# Patient Record
Sex: Male | Born: 1990 | Race: White | Hispanic: No | Marital: Single | Smoking: Never smoker
Health system: Southern US, Community
[De-identification: ages and names within clinical notes are randomized; demographics above are authoritative.]

## PROBLEM LIST (undated history)

## (undated) DIAGNOSIS — R0989 Other specified symptoms and signs involving the circulatory and respiratory systems: Secondary | ICD-10-CM

## (undated) HISTORY — PX: APPENDECTOMY: SHX54

## (undated) HISTORY — DX: Other specified symptoms and signs involving the circulatory and respiratory systems: R09.89

## (undated) HISTORY — PX: SHOULDER SURGERY: SHX246

---
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
## 2011-01-24 ENCOUNTER — Inpatient Hospital Stay (INDEPENDENT_AMBULATORY_CARE_PROVIDER_SITE_OTHER)
Admission: RE | Admit: 2011-01-24 | Discharge: 2011-01-24 | Disposition: A | Discharge status: Home / Self Care | Payer: Commercial Managed Care - PPO | Source: Ambulatory Visit | Attending: Family Medicine | Admitting: Family Medicine

## 2011-01-24 DIAGNOSIS — L259 Unspecified contact dermatitis, unspecified cause: Secondary | ICD-10-CM

## 2011-03-18 ENCOUNTER — Encounter (HOSPITAL_COMMUNITY)
Admission: RE | Admit: 2011-03-18 | Discharge: 2011-03-18 | Disposition: A | Discharge status: Home / Self Care | Payer: 59 | Source: Ambulatory Visit | Attending: Orthopedic Surgery | Admitting: Orthopedic Surgery

## 2011-03-18 DIAGNOSIS — Z0189 Encounter for other specified special examinations: Secondary | ICD-10-CM | POA: Insufficient documentation

## 2011-03-18 LAB — URINALYSIS, ROUTINE W REFLEX MICROSCOPIC
Glucose, UA: NEGATIVE mg/dL
Ketones, ur: NEGATIVE mg/dL
Nitrite: NEGATIVE
Protein, ur: NEGATIVE mg/dL
Specific Gravity, Urine: 1.024 (ref 1.005–1.030)
Urobilinogen, UA: 0.2 mg/dL (ref 0.0–1.0)
pH: 5.5 (ref 5.0–8.0)

## 2011-03-18 LAB — DIFFERENTIAL
Basophils Absolute: 0 10*3/uL (ref 0.0–0.1)
Basophils Relative: 0 % (ref 0–1)
Eosinophils Absolute: 0.1 10*3/uL (ref 0.0–0.7)
Eosinophils Relative: 1 % (ref 0–5)
Lymphocytes Relative: 28 % (ref 12–46)
Lymphs Abs: 2.2 10*3/uL (ref 0.7–4.0)
Monocytes Absolute: 0.6 10*3/uL (ref 0.1–1.0)
Neutro Abs: 5.1 10*3/uL (ref 1.7–7.7)
Neutrophils Relative %: 63 % (ref 43–77)

## 2011-03-18 LAB — BASIC METABOLIC PANEL
BUN: 21 mg/dL (ref 6–23)
CO2: 30 mEq/L (ref 19–32)
Calcium: 9 mg/dL (ref 8.4–10.5)
Chloride: 101 mEq/L (ref 96–112)
Creatinine, Ser: 0.85 mg/dL (ref 0.4–1.5)
GFR calc Af Amer: >60 mL/min (ref >60)
GFR calc non Af Amer: >60 mL/min (ref >60)
Glucose, Bld: 78 mg/dL (ref 70–99)
Potassium: 4.3 mEq/L (ref 3.5–5.1)
Sodium: 139 mEq/L (ref 135–145)

## 2011-03-18 LAB — CBC
HCT: 43 % (ref 39.0–52.0)
Hemoglobin: 15.2 g/dL (ref 13.0–17.0)
MCV: 85 fL (ref 78.0–100.0)
Platelets: 184 10*3/uL (ref 150–400)
RBC: 5.06 MIL/uL (ref 4.22–5.81)
RDW: 13 % (ref 11.5–15.5)
WBC: 8 10*3/uL (ref 4.0–10.5)

## 2011-03-18 LAB — SURGICAL PCR SCREEN: MRSA, PCR: NEGATIVE

## 2011-03-18 LAB — PROTIME-INR
INR: 0.93 (ref 0.00–1.49)
Prothrombin Time: 12.7 seconds (ref 11.6–15.2)

## 2011-03-18 LAB — APTT: aPTT: 30 seconds (ref 24–37)

## 2011-03-19 ENCOUNTER — Ambulatory Visit (HOSPITAL_COMMUNITY): Payer: 59

## 2011-03-19 ENCOUNTER — Ambulatory Visit (HOSPITAL_COMMUNITY)
Admission: RE | Admit: 2011-03-19 | Discharge: 2011-03-19 | Disposition: A | Discharge status: Home / Self Care | Payer: 59 | Source: Ambulatory Visit | Attending: Orthopedic Surgery | Admitting: Orthopedic Surgery

## 2011-03-19 DIAGNOSIS — S42009A Fracture of unspecified part of unspecified clavicle, initial encounter for closed fracture: Secondary | ICD-10-CM | POA: Insufficient documentation

## 2011-03-19 DIAGNOSIS — Z01812 Encounter for preprocedural laboratory examination: Secondary | ICD-10-CM | POA: Insufficient documentation
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
## 2011-03-24 LAB — CBC
HCT: 45.8 % (ref 36.0–49.0)
Hemoglobin: 15.9 g/dL (ref 12.0–16.0)
MCHC: 34.7 g/dL (ref 31.0–37.0)
MCV: 83.9 fL (ref 78.0–98.0)
Platelets: 213 10*3/uL (ref 150–400)
RBC: 5.47 MIL/uL (ref 3.80–5.70)
RDW: 13.5 % (ref 11.4–15.5)
WBC: 17.2 10*3/uL — ABNORMAL HIGH (ref 4.5–13.5)

## 2011-03-24 LAB — URINALYSIS, ROUTINE W REFLEX MICROSCOPIC
Bilirubin Urine: NEGATIVE
Glucose, UA: NEGATIVE mg/dL
Hgb urine dipstick: NEGATIVE
Ketones, ur: NEGATIVE mg/dL
Nitrite: NEGATIVE
Protein, ur: NEGATIVE mg/dL
Specific Gravity, Urine: 1.03 (ref 1.005–1.030)
Urobilinogen, UA: 1 mg/dL (ref 0.0–1.0)
pH: 6 (ref 5.0–8.0)

## 2011-03-24 LAB — COMPREHENSIVE METABOLIC PANEL
ALT: 21 U/L (ref 0–53)
AST: 29 U/L (ref 0–37)
Albumin: 4.2 g/dL (ref 3.5–5.2)
Alkaline Phosphatase: 107 U/L (ref 52–171)
BUN: 16 mg/dL (ref 6–23)
CO2: 27 mEq/L (ref 19–32)
Calcium: 9.2 mg/dL (ref 8.4–10.5)
Chloride: 106 mEq/L (ref 96–112)
Creatinine, Ser: 0.86 mg/dL (ref 0.4–1.5)
Glucose, Bld: 108 mg/dL — ABNORMAL HIGH (ref 70–99)
Potassium: 4 mEq/L (ref 3.5–5.1)
Sodium: 140 mEq/L (ref 135–145)
Total Bilirubin: 0.6 mg/dL (ref 0.3–1.2)
Total Protein: 6.7 g/dL (ref 6.0–8.3)

## 2011-03-24 LAB — DIFFERENTIAL
Basophils Absolute: 0.1 10*3/uL (ref 0.0–0.1)
Basophils Relative: 0 % (ref 0–1)
Eosinophils Absolute: 0.5 10*3/uL (ref 0.0–1.2)
Eosinophils Relative: 3 % (ref 0–5)
Lymphocytes Relative: 20 % — ABNORMAL LOW (ref 24–48)
Lymphs Abs: 3.5 10*3/uL (ref 1.1–4.8)
Monocytes Absolute: 1.2 10*3/uL (ref 0.2–1.2)
Monocytes Relative: 7 % (ref 3–11)
Neutro Abs: 12 10*3/uL — ABNORMAL HIGH (ref 1.7–8.0)
Neutrophils Relative %: 70 % (ref 43–71)

## 2011-03-24 LAB — LIPASE, BLOOD: Lipase: 17 U/L (ref 11–59)

## 2011-04-30 NOTE — Discharge Summary (Signed)
Michael Crane, Michael Crane           ACCOUNT NO.:  [id number]   MEDICAL RECORD NO.:  [id number]          PATIENT TYPE:  INP   LOCATION:  6124                         FACILITY:  [facility]   PHYSICIAN:  Leonia Corona, M.D.  DATE OF BIRTH:  01/22/1991   DATE OF ADMISSION:  06/30/2009  DATE OF DISCHARGE:  07/01/2009                               DISCHARGE SUMMARY   ADMISSION DIAGNOSIS:  Acute appendicitis.   FINAL DIAGNOSIS:  Acute appendicitis.   PROCEDURE PERFORMED:  Laparoscopic appendectomy.   BRIEF HISTORY AND PHYSICAL AND COURSE OF THE HOSPITAL:  This 20 year old  male child was seen in the emergency room for right lower quadrant  abdominal pain that started around the umbilicus about 12 hours ago,  clinically highly suspicious for acute appendicitis.  The diagnosis was  confirmed on CT scan, supported by the lab results of leukocytosis with  left shift.   The patient was recommended laparoscopic appendectomy.  The procedure  was discussed with parents, the risks and benefits were described and  discussed, the parents agreed and consented for the procedure.  The  patient was taken to the operating room for laparoscopic appendectomy.  The procedure was smooth and uneventful.  Postoperatively, the patient  was brought to the pediatric floor where he was kept n.p.o. for 4 hours,  and oral fluids were started after that.  He tolerated oral liquids very  well and the diet was advanced to soft diet.   Next morning on the day of discharge on first postoperative day, he was  in good general condition.  He remained afebrile.  His pain was well  controlled with pain medication including Tylenol with Codeine No. 3 one  to two tablets every 4-6 hours.  He tolerated his breakfast.  His  incision looked clean, dry, and intact.  His abdominal exam was benign.  He was active, alert, and comfortable.  He was discharged with  instruction to keep the incision clean and dry, and take a soft diet,  and use Tylenol with Codeine No. 3 one to two tablets for every 4-6  hours for pain.  A follow up visit in 10 days was set up for  postoperative check.      Leonia Corona, M.D.  Electronically Signed     SF/MEDQ  D:  07/01/2009  T:  07/01/2009  Job:  [id number]   cc:   Sallye Ober A. Twiselton, M.D.

## 2011-04-30 NOTE — Op Note (Signed)
NAMEDAMEN, WINDSOR           ACCOUNT NO.:  [id number]   MEDICAL RECORD NO.:  [id number]          PATIENT TYPE:  INP   LOCATION:  6124                         FACILITY:  [facility]   PHYSICIAN:  Leonia Corona, M.D.  DATE OF BIRTH:  [birth year]   DATE OF PROCEDURE:  DATE OF DISCHARGE:                               OPERATIVE REPORT   PREOPERATIVE DIAGNOSIS:  Acute appendicitis.   POSTOPERATIVE DIAGNOSIS:  Acute appendicitis.   PROCEDURE PERFORMED:  Laparoscopic appendectomy.   ANESTHESIA:  General endotracheal tube anesthesia.   SURGEON:  Leonia Corona, MD   ASSISTANT:  Nurse.   BRIEF PREOPERATIVE NOTE:  This 20 year old male child was seen in the  emergency room for abdominal pain which started peri-umbilically and  migrated to the right lower quadrant where there was acute tenderness on  clinical examination.  This was highly suspicious for acute  appendicitis.  The diagnosis was confirmed on CT scan and supported by  the lab results of leukocytosis with left shift.  The patient was  recommended laparoscopic appendectomy.  The procedure with its risks and  benefits were discussed with family and they consented for the  procedure.   PROCEDURE IN DETAIL:  The patient was brought into the operating room  and placed supine on the operating table.  General endotracheal tube  anesthesia was given.  The abdomen and the surrounding area of the  abdominal wall was cleaned, prepped and draped in the usual manner.  A  16-French Foley catheter was placed prior to prepping the patient to  keep the bladder deflated during the procedure.  The first incision was  made infra-umbilically in a curvilinear fashion measuring about 1.5 cm.  The incision was deepened through the subcutaneous tissue using  electrocautery.  A blunt and sharp dissection was carried out and fascia  was incised in the center to gain access into the peritoneum.  Right  index finger was introduced into the peritoneum  and the fascia was held  up with the help of 0 Vicryl as a stay suture.  A 10/12 French Hasson  cannula was introduced into the peritoneum as the stay suture was tied  over it to hold in place.  CO2 insufflation was done and  pneumoperitoneum was obtained.  A 5mm, 30-degree camera was introduced  to have a preliminary exam of the abdominal cavity.  The appendix was  found to be acutely inflamed, present in the right lower quadrant,  covered by omentum.  At this point, the second port was placed in the  right upper quadrant for which a small incision was made and the abdomen  was pierced with the trocar under direct vision of the camera from  within the peritoneal cavity.  The third trocar was placed in the left  lower quadrant.  A 5-mm port was placed through a small incision under  direct vision of the camera from within the peritoneal cavity.   Working through these three ports with the camera in the umbilical port,  the appendix was dissected.  The patient was given a head down and left  tilt position to displace the loops of  bowel and expose the right lower  quadrant for better dissection.  Appendix was grasped with a grasper in  the right upper quadrant port and mesoappendix was divided using  Harmonic scalpel until the base of the appendix was clear.  At this  point, camera was switched to the left lower quadrant port and 10-mm  Endo-GIA stapler was placed at the junction of appendix with the cecum.  After ensuring correct placement, it was fired which sealed, stapled,  and divided the appendix from the cecum.  The free appendix was  delivered out of the abdomen using an Endocatch bag through the  umbilical port along with the port.  The port was reintroduced.  Pneumoperitoneum was reestablished.  Thorough irrigation of the right  lower quadrant with normal saline was done until the returning fluid was  clear.  Fluid gravitated into the pelvis was also suctioned out until it  was  cleared.  The fluid above the liver surface was also suctioned out.  A thorough irrigation was done and suctioning was done until the  draining fluid was clear.  The patient was placed in a horizontal  position once again.  All the fluid was suctioned out.  The staple line  on the cecal wall was inspected once again.  There was no oozing,  bleeding, or leak.  Finally, the ports were removed under direct vision  of camera from within the peritoneal cavity without any evidence of  bleeding from the abdominal wall and finally the umbilical port was  removed.  The umbilical port site was closed in 2 layers, the deep layer  using 0 Vicryl for the fascia and the skin with 5-0 Monocryl  subcuticular stitch.  Approximately 18 mL of 0.25% Marcaine with  epinephrine was infiltrated in and around all the incision for  postoperative pain control.  The 5-mm port sites were closed only at the  skin level using 5-0 Monocryl in the subcuticular fashion.  Wound was  cleaned and dried.  Dermabond dressing was applied and kept open without  any gauze cover.  The patient tolerated the procedure very well.  Foley  catheter was removed prior to waking of the patient.  There was  approximately 800 mL of clear urine in the bag.  The patient was later  extubated and transported to the recovery room in good stable condition.      Leonia Corona, M.D.  Electronically Signed     SF/MEDQ  D:  06/30/2009  T:  06/30/2009  Job:  [id number]   cc:   Sallye Ober A. Twiselton, M.D.

## 2012-09-23 ENCOUNTER — Emergency Department (HOSPITAL_COMMUNITY)
Admission: EM | Admit: 2012-09-23 | Discharge: 2012-09-23 | Disposition: A | Discharge status: Home / Self Care | Payer: 59 | Source: Home / Self Care | Attending: Emergency Medicine | Admitting: Emergency Medicine

## 2012-09-23 ENCOUNTER — Encounter (HOSPITAL_COMMUNITY): Payer: Self-pay

## 2012-09-23 DIAGNOSIS — T1590XA Foreign body on external eye, part unspecified, unspecified eye, initial encounter: Secondary | ICD-10-CM

## 2012-09-23 DIAGNOSIS — H5710 Ocular pain, unspecified eye: Secondary | ICD-10-CM

## 2012-09-23 DIAGNOSIS — H5712 Ocular pain, left eye: Secondary | ICD-10-CM

## 2012-09-23 MED ORDER — BACITRACIN-POLYMYXIN B 500-10000 UNIT/GM OP OINT: TOPICAL_OINTMENT | Freq: Two times a day (BID) | OPHTHALMIC | Status: DC

## 2012-09-23 MED ORDER — TETRACAINE HCL 0.5 % OP SOLN
OPHTHALMIC | Status: AC
  Filled 2012-09-23: qty 2

## 2012-09-23 NOTE — ED Provider Notes (Signed)
History     CSN: [id number]  Arrival date & time 09/23/12  1546   None     Chief Complaint  Patient presents with  . Eye Problem    (Consider location/radiation/quality/duration/timing/severity/associated sxs/prior treatment) Patient is a 21 y.o. male presenting with eye problem. The history is provided by the patient.  Eye Problem  This is a new problem. The current episode started more than 2 days ago (4 days ago). The problem occurs daily. The problem has not changed since onset.There is pain in the left eye. The injury mechanism was a chemical exposure (working on car, foreign body sensation). The pain is mild. There is no history of trauma to the eye. There is no known exposure to pink eye. He does not wear contacts. Associated symptoms include blurred vision, foreign body sensation and eye redness. Pertinent negatives include no decreased vision, no discharge, no photophobia, no nausea, no vomiting, no tingling, no weakness and no itching.    History reviewed. No pertinent past medical history.  Past Surgical History  Procedure Date  . Appendectomy   . Shoulder surgery     pin in left shoulder    No family history on file.  History  Substance Use Topics  . Smoking status: Not on file  . Smokeless tobacco: Current User    Types: Chew  . Alcohol Use: No      Review of Systems  Constitutional: Negative.   HENT: Negative.   Eyes: Positive for blurred vision, pain, redness and visual disturbance. Negative for photophobia, discharge and itching.  Respiratory: Negative.   Cardiovascular: Negative.   Gastrointestinal: Negative for nausea and vomiting.  Skin: Negative for itching.  Neurological: Negative for tingling and weakness.    Allergies  Review of patient's allergies indicates no known allergies.  Home Medications   Current Outpatient Rx  Name Route Sig Dispense Refill  . BACITRACIN-POLYMYXIN B 500-10000 UNIT/GM OP OINT Left Eye Place into the left eye  every 12 (twelve) hours. apply to eye every 12 hours while awake 3.5 g 0    BP 129/54  Pulse 60  Temp 97.5 F (36.4 C) (Oral)  Resp 15  SpO2 100%  Physical Exam  Nursing note and vitals reviewed. Constitutional: He is oriented to person, place, and time. Vital signs are normal. He appears well-developed and well-nourished. He is active and cooperative.  HENT:  Head: Normocephalic.  Eyes: EOM are normal. Pupils are equal, round, and reactive to light. Right eye exhibits no chemosis, no discharge, no exudate and no hordeolum. No foreign body present in the right eye. Left eye exhibits no chemosis, no discharge, no exudate and no hordeolum. Foreign body present in the left eye. Right conjunctiva is not injected. Right conjunctiva has no hemorrhage. Left conjunctiva is injected. Left conjunctiva has no hemorrhage. No scleral icterus.         Tetracaine and fluoroscien applied to left eye, examined with woods lamp.  Foreign body noted, unable to dislodge with moist cotton tip applicators. Pt tolerated procedure well  Neck: Trachea normal. Neck supple.  Cardiovascular: Normal rate and regular rhythm.   Pulmonary/Chest: Effort normal and breath sounds normal.  Neurological: He is alert and oriented to person, place, and time. No cranial nerve deficit or sensory deficit.  Skin: Skin is warm and dry.  Psychiatric: He has a normal mood and affect. His speech is normal and behavior is normal. Judgment and thought content normal. Cognition and memory are normal.    ED Course  Procedures (including critical care time)  Labs Reviewed - No data to display No results found.   1. Foreign body, eye   2. Left eye pain       MDM  Phone consult opthalomologist Dr. Hermina Barters see in office tomorrow morning at 0730, begin on polymyxin ointment.       Johnsie Kindred, NP 09/23/12 1717

## 2012-09-23 NOTE — ED Provider Notes (Signed)
Medical screening examination/treatment/procedure(s) were performed by non-physician practitioner and as supervising physician I was immediately available for consultation/collaboration.  I saw this patient along with Porfirio Mylar and attempted to remove the foreign body with a 22-gauge needle, but was unable to do so. I recommended that she call the ophthalmologist on call and see if she could get the patient in to see him tomorrow.  Leslee Home, M.D.   Reuben Likes, MD 09/23/12 [phone]

## 2012-09-23 NOTE — ED Notes (Signed)
Patient states he was working on a car on 09/19/2012 and thinks he got some material in it, states does have "cloudy" vision in his right eye

## 2014-07-03 DIAGNOSIS — R0989 Other specified symptoms and signs involving the circulatory and respiratory systems: Secondary | ICD-10-CM | POA: Insufficient documentation

## 2014-07-05 ENCOUNTER — Ambulatory Visit (INDEPENDENT_AMBULATORY_CARE_PROVIDER_SITE_OTHER): Payer: 59 | Admitting: Internal Medicine

## 2014-07-05 ENCOUNTER — Encounter: Payer: Self-pay | Admitting: Internal Medicine

## 2014-07-05 VITALS — BP 136/80 | HR 72 | Temp 98.4°F | Resp 16 | Ht 74.0 in | Wt 249.4 lb

## 2014-07-05 DIAGNOSIS — Z0000 Encounter for general adult medical examination without abnormal findings: Secondary | ICD-10-CM

## 2014-07-05 DIAGNOSIS — R0989 Other specified symptoms and signs involving the circulatory and respiratory systems: Secondary | ICD-10-CM

## 2014-07-05 DIAGNOSIS — R740 Nonspecific elevation of levels of transaminase and lactic acid dehydrogenase [LDH]: Secondary | ICD-10-CM

## 2014-07-05 DIAGNOSIS — Z1212 Encounter for screening for malignant neoplasm of rectum: Secondary | ICD-10-CM

## 2014-07-05 DIAGNOSIS — E559 Vitamin D deficiency, unspecified: Secondary | ICD-10-CM

## 2014-07-05 DIAGNOSIS — R7401 Elevation of levels of liver transaminase levels: Secondary | ICD-10-CM

## 2014-07-05 DIAGNOSIS — Z111 Encounter for screening for respiratory tuberculosis: Secondary | ICD-10-CM

## 2014-07-05 DIAGNOSIS — Z113 Encounter for screening for infections with a predominantly sexual mode of transmission: Secondary | ICD-10-CM

## 2014-07-05 LAB — CBC WITH DIFFERENTIAL/PLATELET
BASOS ABS: 0 10*3/uL (ref 0.0–0.1)
BASOS PCT: 0 % (ref 0–1)
EOS ABS: 0.3 10*3/uL (ref 0.0–0.7)
Eosinophils Relative: 3 % (ref 0–5)
HCT: 46.2 % (ref 39.0–52.0)
Hemoglobin: 16.2 g/dL (ref 13.0–17.0)
LYMPHS ABS: 3.1 10*3/uL (ref 0.7–4.0)
Lymphocytes Relative: 31 % (ref 12–46)
MCH: 29.1 pg (ref 26.0–34.0)
MCHC: 35.1 g/dL (ref 30.0–36.0)
MCV: 82.9 fL (ref 78.0–100.0)
Monocytes Absolute: 0.7 10*3/uL (ref 0.1–1.0)
Monocytes Relative: 7 % (ref 3–12)
NEUTROS PCT: 59 % (ref 43–77)
Neutro Abs: 5.8 10*3/uL (ref 1.7–7.7)
PLATELETS: 208 10*3/uL (ref 150–400)
RBC: 5.57 MIL/uL (ref 4.22–5.81)
RDW: 13.8 % (ref 11.5–15.5)
WBC: 9.9 10*3/uL (ref 4.0–10.5)

## 2014-07-05 NOTE — Patient Instructions (Addendum)
Recommend you take   Low dose coated Aspirin 81 mg  And Vitamin D 5,000 units /daily  ++++++++++++++++++++++++++++++++++++++++++++   Preventive Care for Adults A healthy lifestyle and preventive care can promote health and wellness. Preventive health guidelines for men include the following key practices:  A routine yearly physical is a good way to check with your health care provider about your health and preventative screening. It is a chance to share any concerns and updates on your health and to receive a thorough exam.  Visit your dentist for a routine exam and preventative care every 6 months. Brush your teeth twice a day and floss once a day. Good oral hygiene prevents tooth decay and gum disease.  The frequency of eye exams is based on your age, health, family medical history, use of contact lenses, and other factors. Follow your health care provider's recommendations for frequency of eye exams.  Eat a healthy diet. Foods such as vegetables, fruits, whole grains, low-fat dairy products, and lean protein foods contain the nutrients you need without too many calories. Decrease your intake of foods high in solid fats, added sugars, and salt. Eat the right amount of calories for you.Get information about a proper diet from your health care provider, if necessary.  Regular physical exercise is one of the most important things you can do for your health. Most adults should get at least 150 minutes of moderate-intensity exercise (any activity that increases your heart rate and causes you to sweat) each week. In addition, most adults need muscle-strengthening exercises on 2 or more days a week.  Maintain a healthy weight. The body mass index (BMI) is a screening tool to identify possible weight problems. It provides an estimate of body fat based on height and weight. Your health care provider can find your BMI and can help you achieve or maintain a healthy weight.For adults 20 years and  older:  A BMI below 18.5 is considered underweight.  A BMI of 18.5 to 24.9 is normal.  A BMI of 25 to 29.9 is considered overweight.  A BMI of 30 and above is considered obese.  Maintain normal blood lipids and cholesterol levels by exercising and minimizing your intake of saturated fat. Eat a balanced diet with plenty of fruit and vegetables. Blood tests for lipids and cholesterol should begin at age 28 and be repeated every 5 years. If your lipid or cholesterol levels are high, you are over 50, or you are at high risk for heart disease, you may need your cholesterol levels checked more frequently.Ongoing high lipid and cholesterol levels should be treated with medicines if diet and exercise are not working.  If you smoke, find out from your health care provider how to quit. If you do not use tobacco, do not start.  Lung cancer screening is recommended for adults aged 82-80 years who are at high risk for developing lung cancer because of a history of smoking. A yearly low-dose CT scan of the lungs is recommended for people who have at least a 30-pack-year history of smoking and are a current smoker or have quit within the past 15 years. A pack year of smoking is smoking an average of 1 pack of cigarettes a day for 1 year (for example: 1 pack a day for 30 years or 2 packs a day for 15 years). Yearly screening should continue until the smoker has stopped smoking for at least 15 years. Yearly screening should be stopped for people who develop a health  problem that would prevent them from having lung cancer treatment.  If you choose to drink alcohol, do not have more than 2 drinks per day. One drink is considered to be 12 ounces (355 mL) of beer, 5 ounces (148 mL) of wine, or 1.5 ounces (44 mL) of liquor.  Avoid use of street drugs. Do not share needles with anyone. Ask for help if you need support or instructions about stopping the use of drugs.  High blood pressure causes heart disease and  increases the risk of stroke. Your blood pressure should be checked at least every 1-2 years. Ongoing high blood pressure should be treated with medicines, if weight loss and exercise are not effective.  If you are 25-43 years old, ask your health care provider if you should take aspirin to prevent heart disease.  Diabetes screening involves taking a blood sample to check your fasting blood sugar level. This should be done once every 3 years, after age 21, if you are within normal weight and without risk factors for diabetes. Testing should be considered at a younger age or be carried out more frequently if you are overweight and have at least 1 risk factor for diabetes.  Colorectal cancer can be detected and often prevented. Most routine colorectal cancer screening begins at the age of 54 and continues through age 29. However, your health care provider may recommend screening at an earlier age if you have risk factors for colon cancer. On a yearly basis, your health care provider may provide home test kits to check for hidden blood in the stool. Use of a small camera at the end of a tube to directly examine the colon (sigmoidoscopy or colonoscopy) can detect the earliest forms of colorectal cancer. Talk to your health care provider about this at age 39, when routine screening begins. Direct exam of the colon should be repeated every 5-10 years through age 50, unless early forms of precancerous polyps or small growths are found.  People who are at an increased risk for hepatitis B should be screened for this virus. You are considered at high risk for hepatitis B if:  You were born in a country where hepatitis B occurs often. Talk with your health care provider about which countries are considered high risk.  Your parents were born in a high-risk country and you have not received a shot to protect against hepatitis B (hepatitis B vaccine).  You have HIV or AIDS.  You use needles to inject street  drugs.  You live with, or have sex with, someone who has hepatitis B.  You are a man who has sex with other men (MSM).  You get hemodialysis treatment.  You take certain medicines for conditions such as cancer, organ transplantation, and autoimmune conditions.  Hepatitis C blood testing is recommended for all people born from [birth year] through 1964 and any individual with known risks for hepatitis C.  Practice safe sex. Use condoms and avoid high-risk sexual practices to reduce the spread of sexually transmitted infections (STIs). STIs include gonorrhea, chlamydia, syphilis, trichomonas, herpes, HPV, and human immunodeficiency virus (HIV). Herpes, HIV, and HPV are viral illnesses that have no cure. They can result in disability, cancer, and death.  If you are at risk of being infected with HIV, it is recommended that you take a prescription medicine daily to prevent HIV infection. This is called preexposure prophylaxis (PrEP). You are considered at risk if:  You are a man who has sex with other men (MSM)  and have other risk factors.  You are a heterosexual man, are sexually active, and are at increased risk for HIV infection.  You take drugs by injection.  You are sexually active with a partner who has HIV.  Talk with your health care provider about whether you are at high risk of being infected with HIV. If you choose to begin PrEP, you should first be tested for HIV. You should then be tested every 3 months for as long as you are taking PrEP.  A one-time screening for abdominal aortic aneurysm (AAA) and surgical repair of large AAAs by ultrasound are recommended for men ages 32 to 53 years who are current or former smokers.  Healthy men should no longer receive prostate-specific antigen (PSA) blood tests as part of routine cancer screening. Talk with your health care provider about prostate cancer screening.  Testicular cancer screening is not recommended for adult males who have no  symptoms. Screening includes self-exam, a health care provider exam, and other screening tests. Consult with your health care provider about any symptoms you have or any concerns you have about testicular cancer.  Use sunscreen. Apply sunscreen liberally and repeatedly throughout the day. You should seek shade when your shadow is shorter than you. Protect yourself by wearing long sleeves, pants, a wide-brimmed hat, and sunglasses year round, whenever you are outdoors.  Once a month, do a whole-body skin exam, using a mirror to look at the skin on your back. Tell your health care provider about new moles, moles that have irregular borders, moles that are larger than a pencil eraser, or moles that have changed in shape or color.  Stay current with required vaccines (immunizations).  Influenza vaccine. All adults should be immunized every year.  Tetanus, diphtheria, and acellular pertussis (Td, Tdap) vaccine. An adult who has not previously received Tdap or who does not know his vaccine status should receive 1 dose of Tdap. This initial dose should be followed by tetanus and diphtheria toxoids (Td) booster doses every 10 years. Adults with an unknown or incomplete history of completing a 3-dose immunization series with Td-containing vaccines should begin or complete a primary immunization series including a Tdap dose. Adults should receive a Td booster every 10 years.  Varicella vaccine. An adult without evidence of immunity to varicella should receive 2 doses or a second dose if he has previously received 1 dose.  Human papillomavirus (HPV) vaccine. Males aged 62-21 years who have not received the vaccine previously should receive the 3-dose series. Males aged 22-26 years may be immunized. Immunization is recommended through the age of 70 years for any male who has sex with males and did not get any or all doses earlier. Immunization is recommended for any person with an immunocompromised condition  through the age of 90 years if he did not get any or all doses earlier. During the 3-dose series, the second dose should be obtained 4-8 weeks after the first dose. The third dose should be obtained 24 weeks after the first dose and 16 weeks after the second dose.  Zoster vaccine. One dose is recommended for adults aged 45 years or older unless certain conditions are present.  Measles, mumps, and rubella (MMR) vaccine. Adults born before [birth year] generally are considered immune to measles and mumps. Adults born in [birth year] or later should have 1 or more doses of MMR vaccine unless there is a contraindication to the vaccine or there is laboratory evidence of immunity to each of the three  diseases. A routine second dose of MMR vaccine should be obtained at least 28 days after the first dose for students attending postsecondary schools, health care workers, or international travelers. People who received inactivated measles vaccine or an unknown type of measles vaccine during 1963-1967 should receive 2 doses of MMR vaccine. People who received inactivated mumps vaccine or an unknown type of mumps vaccine before 1979 and are at high risk for mumps infection should consider immunization with 2 doses of MMR vaccine. Unvaccinated health care workers born before [birth year] who lack laboratory evidence of measles, mumps, or rubella immunity or laboratory confirmation of disease should consider measles and mumps immunization with 2 doses of MMR vaccine or rubella immunization with 1 dose of MMR vaccine.  Pneumococcal 13-valent conjugate (PCV13) vaccine. When indicated, a person who is uncertain of his immunization history and has no record of immunization should receive the PCV13 vaccine. An adult aged 67 years or older who has certain medical conditions and has not been previously immunized should receive 1 dose of PCV13 vaccine. This PCV13 should be followed with a dose of pneumococcal polysaccharide (PPSV23) vaccine. The PPSV23  vaccine dose should be obtained at least 8 weeks after the dose of PCV13 vaccine. An adult aged 24 years or older who has certain medical conditions and previously received 1 or more doses of PPSV23 vaccine should receive 1 dose of PCV13. The PCV13 vaccine dose should be obtained 1 or more years after the last PPSV23 vaccine dose.  Pneumococcal polysaccharide (PPSV23) vaccine. When PCV13 is also indicated, PCV13 should be obtained first. All adults aged 67 years and older should be immunized. An adult younger than age 75 years who has certain medical conditions should be immunized. Any person who resides in a nursing home or long-term care facility should be immunized. An adult smoker should be immunized. People with an immunocompromised condition and certain other conditions should receive both PCV13 and PPSV23 vaccines. People with human immunodeficiency virus (HIV) infection should be immunized as soon as possible after diagnosis. Immunization during chemotherapy or radiation therapy should be avoided. Routine use of PPSV23 vaccine is not recommended for American Indians, [place] Natives, or people younger than 65 years unless there are medical conditions that require PPSV23 vaccine. When indicated, people who have unknown immunization and have no record of immunization should receive PPSV23 vaccine. One-time revaccination 5 years after the first dose of PPSV23 is recommended for people aged 19-64 years who have chronic kidney failure, nephrotic syndrome, asplenia, or immunocompromised conditions. People who received 1-2 doses of PPSV23 before age 23 years should receive another dose of PPSV23 vaccine at age 43 years or later if at least 5 years have passed since the previous dose. Doses of PPSV23 are not needed for people immunized with PPSV23 at or after age 14 years.  Meningococcal vaccine. Adults with asplenia or persistent complement component deficiencies should receive 2 doses of quadrivalent  meningococcal conjugate (MenACWY-D) vaccine. The doses should be obtained at least 2 months apart. Microbiologists working with certain meningococcal bacteria, [place] recruits, people at risk during an outbreak, and people who travel to or live in countries with a high rate of meningitis should be immunized. A first-year college student up through age 73 years who is living in a residence hall should receive a dose if he did not receive a dose on or after his 16th birthday. Adults who have certain high-risk conditions should receive one or more doses of vaccine.  Hepatitis A vaccine. Adults who  wish to be protected from this disease, have certain high-risk conditions, work with hepatitis A-infected animals, work in hepatitis A research labs, or travel to or work in countries with a high rate of hepatitis A should be immunized. Adults who were previously unvaccinated and who anticipate close contact with an international adoptee during the first 60 days after arrival in the [place] from a country with a high rate of hepatitis A should be immunized.  Hepatitis B vaccine. Adults should be immunized if they wish to be protected from this disease, have certain high-risk conditions, may be exposed to blood or other infectious body fluids, are household contacts or sex partners of hepatitis B positive people, are clients or workers in certain care facilities, or travel to or work in countries with a high rate of hepatitis B.  Haemophilus influenzae type b (Hib) vaccine. A previously unvaccinated person with asplenia or sickle cell disease or having a scheduled splenectomy should receive 1 dose of Hib vaccine. Regardless of previous immunization, a recipient of a hematopoietic stem cell transplant should receive a 3-dose series 6-12 months after his successful transplant. Hib vaccine is not recommended for adults with HIV infection. Preventive Service / Frequency Ages 37 to 18  Blood pressure check.** /  Every 1 to 2 years.  Lipid and cholesterol check.** / Every 5 years beginning at age 7.  Hepatitis C blood test.** / For any individual with known risks for hepatitis C.  Skin self-exam. / Monthly.  Influenza vaccine. / Every year.  Tetanus, diphtheria, and acellular pertussis (Tdap, Td) vaccine.** / Consult your health care provider. 1 dose of Td every 10 years.  Varicella vaccine.** / Consult your health care provider.  HPV vaccine. / 3 doses over 6 months, if 58 or younger.  Measles, mumps, rubella (MMR) vaccine.** / You need at least 1 dose of MMR if you were born in 1957 or later. You may also need a second dose.  Pneumococcal 13-valent conjugate (PCV13) vaccine.** / Consult your health care provider.  Pneumococcal polysaccharide (PPSV23) vaccine.** / 1 to 2 doses if you smoke cigarettes or if you have certain conditions.  Meningococcal vaccine.** / 1 dose if you are age 14 to 28 years and a Market researcher living in a residence hall, or have one of several medical conditions. You may also need additional booster doses.  Hepatitis A vaccine.** / Consult your health care provider.  Hepatitis B vaccine.** / Consult your health care provider.  Haemophilus influenzae type b (Hib) vaccine.** / Consult your health care provider.  ++++++++++++++++++++++++++++++++++++++++++++++++++++   Recommend the book "The END of DIETING" by Dr Baker Janus   and the book "The END of DIABETES " by Dr Excell Seltzer  At [email or web address] - get book & Audio CD's      Being diabetic has a  300% increased risk for heart attack, stroke, cancer, and alzheimer- type vascular dementia. It is very important that you work harder with diet by avoiding all foods that are white except chicken & fish. Avoid white rice (brown & wild rice is OK), white potatoes (sweetpotatoes in moderation is OK), White bread or wheat bread or anything made out of white flour like bagels, donuts, rolls, buns, biscuits,  cakes, pastries, cookies, pizza crust, and pasta (made from white flour & egg whites) - vegetarian pasta or spinach or wheat pasta is OK. Multigrain breads like Arnold's or [employer], or multigrain sandwich thins or flatbreads.  Diet, exercise and weight loss can reverse  and cure diabetes in the early stages.  Diet, exercise and weight loss is very important in the control and prevention of complications of diabetes which affects every system in your body, ie. Brain - dementia/stroke, eyes - glaucoma/blindness, heart - heart attack/heart failure, kidneys - dialysis, stomach - gastric paralysis, intestines - malabsorption, nerves - severe painful neuritis, circulation - gangrene & loss of a leg(s), and finally cancer and Alzheimers.    I recommend avoid fried & greasy foods,  sweets/candy, white rice (brown or wild rice or Quinoa is OK), white potatoes (sweet potatoes are OK) - anything made from white flour - bagels, doughnuts, rolls, buns, biscuits,white and wheat breads, pizza crust and traditional pasta made of white flour & egg white(vegetarian pasta or spinach or wheat pasta is OK).  Multi-grain bread is OK - like multi-grain flat bread or sandwich thins. Avoid alcohol in excess. Exercise is also important.    Eat all the vegetables you want - avoid meat, especially red meat and dairy - especially cheese.  Cheese is the most concentrated form of trans-fats which is the worst thing to clog up our arteries. Veggie cheese is OK which can be found in the fresh produce section at [employer] or [employer] or [employer]   Hypertension Hypertension is another name for high blood pressure. High blood pressure forces your heart to work harder to pump blood. A blood pressure reading has two numbers, which includes a higher number over a lower number (example: 110/72). HOME CARE   Have your blood pressure rechecked by your doctor.  Only take medicine as told by your doctor. Follow the directions  carefully. The medicine does not work as well if you skip doses. Skipping doses also puts you at risk for problems.  Do not smoke.  Monitor your blood pressure at home as told by your doctor. GET HELP IF:  You think you are having a reaction to the medicine you are taking.  You have repeat headaches or feel dizzy.  You have puffiness (swelling) in your ankles.  You have trouble with your vision. GET HELP RIGHT AWAY IF:   You get a very bad headache and are confused.  You feel weak, numb, or faint.  You get chest or belly (abdominal) pain.  You throw up (vomit).  You cannot breathe very well. MAKE SURE YOU:   Understand these instructions.  Will watch your condition.  Will get help right away if you are not doing well or get worse. Document Released: 05/20/2008 Document Revised: 12/07/2013 Document Reviewed: 09/24/2013 [facility] Patient Information 2015 [place], [place]. This information is not intended to replace advice given to you by your health care provider. Make sure you discuss any questions you have with your health care provider.   Vitamin D Deficiency Vitamin D is an important vitamin that your body needs. Having too little of it in your body is called a deficiency. A very bad deficiency can make your bones soft and can cause a condition called rickets.  Vitamin D is important to your body for different reasons, such as:   It helps your body absorb 2 minerals called calcium and phosphorus.  It helps make your bones healthy.  It may prevent some diseases, such as diabetes and multiple sclerosis.  It helps your muscles and heart. You can get vitamin D in several ways. It is a natural part of some foods. The vitamin is also added to some dairy products and cereals. Some people take vitamin D supplements.  Also, your body makes vitamin D when you are in the sun. It changes the sun's rays into a form of the vitamin that your body can use. CAUSES   Not eating enough  foods that contain vitamin D.  Not getting enough sunlight.  Having certain digestive system diseases that make it hard to absorb vitamin D. These diseases include Crohn's disease, chronic pancreatitis, and cystic fibrosis.  Having a surgery in which part of the stomach or small intestine is removed.  Being obese. Fat cells pull vitamin D out of your blood. That means that obese people may not have enough vitamin D left in their blood and in other body tissues.  Having chronic kidney or liver disease. RISK FACTORS Risk factors are things that make you more likely to develop a vitamin D deficiency. They include:  Being older.  Not being able to get outside very much.  Living in a nursing home.  Having had broken bones.  Having weak or thin bones (osteoporosis).  Having a disease or condition that changes how your body absorbs vitamin D.  Having dark skin.  Some medicines such as seizure medicines or steroids.  Being overweight or obese. SYMPTOMS Mild cases of vitamin D deficiency may not have any symptoms. If you have a very bad case, symptoms may include:  Bone pain.  Muscle pain.  Falling often.  Broken bones caused by a minor injury, due to osteoporosis. DIAGNOSIS A blood test is the best way to tell if you have a vitamin D deficiency. TREATMENT Vitamin D deficiency can be treated in different ways. Treatment for vitamin D deficiency depends on what is causing it. Options include:  Taking vitamin D supplements.  Taking a calcium supplement. Your caregiver will suggest what dose is best for you. HOME CARE INSTRUCTIONS  Take any supplements that your caregiver prescribes. Follow the directions carefully. Take only the suggested amount.  Have your blood tested 2 months after you start taking supplements.  Eat foods that contain vitamin D. Healthy choices include:  Fortified dairy products, cereals, or juices. Fortified means vitamin D has been added to the  food. Check the label on the package to be sure.  Fatty fish like salmon or trout.  Eggs.  Oysters.  Do not use a tanning bed.  Keep your weight at a healthy level. Lose weight if you need to.  Keep all follow-up appointments. Your caregiver will need to perform blood tests to make sure your vitamin D deficiency is going away. SEEK MEDICAL CARE IF:  You have any questions about your treatment.  You continue to have symptoms of vitamin D deficiency.  You have nausea or vomiting.  You are constipated.  You feel confused.  You have severe abdominal or back pain. MAKE SURE YOU:  Understand these instructions.  Will watch your condition.  Will get help right away if you are not doing well or get worse. Document Released: 02/24/2012 Document Revised: 03/29/2013 Document Reviewed: 02/24/2012 [facility] Patient Information 2015 [place], [place]. This information is not intended to replace advice given to you by your health care provider. Make sure you discuss any questions you have with your health care provider.

## 2014-07-05 NOTE — Progress Notes (Signed)
Patient ID: Michael CarolinMatthew Crane, male   DOB: [birth year], 23 y.o.   MRN: [id number]   Annual Screening Comprehensive Examination   This very nice 22 y.o.male presents for complete physical.  Patient has no major health issues. He did have an elevated BP 160/92 in July 2014, but reports a nl BP ~ 1 month ago and BP is nl today.He has had a low Testosterone 224 in the past and repeat was up to 299. He denies any Low T Sx's. Finally, patient has history of Vitamin D Deficiency of 38 in 2014 and he sporadically takes his supplements.  On no Meds  No Known Allergies  Past Medical History  Diagnosis Date  . Labile hypertension     Past Surgical History  Procedure Laterality Date  . Appendectomy    . Shoulder surgery      pin in left shoulder    Family History  Problem Relation Age of Onset  . Diabetes Father   . Heart disease Father     History   Social History  . Marital Status: Single    Spouse Name: N/A    Number of Children: N/A  . Years of Education: N/A   Occupational History  . Supervisor of a crew framing houses   Social History Main Topics  . Smoking status: Never Smoker   . Smokeless tobacco: Current User    Types: Chew  . Alcohol Use: Yes     Comment: ocassional  . Drug Use: No  . Sexual Activity: Active   Social History Narrative  . Patient relates he is signed up to enter the [employer] in 1 month    ROS Constitutional: Denies fever, chills, weight loss/gain, headaches, insomnia, fatigue, night sweats, and change in appetite. Eyes: Denies redness, blurred vision, diplopia, discharge, itchy, watery eyes.  ENT: Denies discharge, congestion, post nasal drip, epistaxis, sore throat, earache, hearing loss, dental pain, Tinnitus, Vertigo, Sinus pain, snoring.  Cardio: Denies chest pain, palpitations, irregular heartbeat, syncope, dyspnea, diaphoresis, orthopnea, PND, claudication, edema Respiratory: denies cough, dyspnea, DOE, pleurisy, hoarseness, laryngitis,  wheezing.  Gastrointestinal: Denies dysphagia, heartburn, reflux, water brash, pain, cramps, nausea, vomiting, bloating, diarrhea, constipation, hematemesis, melena, hematochezia, jaundice, hemorrhoids Genitourinary: Denies dysuria, frequency, urgency, nocturia, hesitancy, discharge, hematuria, flank pain Breast: Breast lumps, nipple discharge, bleeding.  Musculoskeletal: Denies arthralgia, myalgia, stiffness, Jt. Swelling, pain, limp, and strain/sprain. Skin: Denies puritis, rash, hives, warts, acne, eczema, changing in skin lesion Neuro: Weakness, tremor, incoordination, spasms, paresthesia, pain Psychiatric: Denies confusion, memory loss, sensory loss. Endocrine: Denies change in weight, skin, hair change, nocturia, and paresthesia, diabetic polys, visual blurring, hyper /hypo glycemic episodes.  Heme/Lymph: No excessive bleeding, bruising, enlarged lymph nodes.   Physical Exam  BP 136/80  Pulse 72  Temp(Src) 98.4 F (36.9 C) (Temporal)  Resp 16  Ht 6\' 2"  (1.88 m)  Wt 249 lb 6.4 oz (113.127 kg)  BMI 32.01 kg/m2  General Appearance: Well nourished and in no apparent distress. Eyes: PERRLA, EOMs, conjunctiva no swelling or erythema, normal fundi and vessels. Sinuses: No frontal/maxillary tenderness ENT/Mouth: EACs patent / TMs  nl. Nares clear without erythema, swelling, mucoid exudates. Oral hygiene is good. No erythema, swelling, or exudate. Tongue normal, non-obstructing. Tonsils not swollen or erythematous. Hearing normal.  Neck: Supple, thyroid normal. No bruits, nodes or JVD. Respiratory: Respiratory effort normal.  BS equal and clear bilateral without rales, rhonci, wheezing or stridor. Cardio: Heart sounds are normal with regular rate and rhythm and no murmurs, rubs or gallops. Peripheral pulses  are normal and equal bilaterally without edema. No aortic or femoral bruits. Chest: symmetric with normal excursions and percussion. Breasts: Symmetric, without lumps, nipple  discharge, retractions, or fibrocystic changes.  Abdomen: Flat, soft, with bowl sounds. Nontender, no guarding, rebound, hernias, masses, or organomegaly.  Lymphatics: Non tender without lymphadenopathy.  Genitourinary: No hernia Testes & cords are nl.  Musculoskeletal: Full ROM all peripheral extremities, joint stability, 5/5 strength, and normal gait. Skin: Warm and dry without rashes, lesions, cyanosis, clubbing or  ecchymosis.  Neuro: Cranial nerves intact, reflexes equal bilaterally. Normal muscle tone, no cerebellar symptoms. Sensation intact.  Pysch: Awake and oriented X 3, normal affect, Insight and Judgment appropriate.   Assessment and Plan  1. Annual Screening Examination 2. Labile Hypertension 3. Testosterone Deficiency  Continue prudent diet as discussed, weight control, regular exercise, and medications. Routine screening labs and tests as requested with regular follow-up as recommended.

## 2014-07-06 ENCOUNTER — Encounter: Payer: Self-pay | Admitting: Internal Medicine

## 2014-07-06 LAB — HEMOGLOBIN A1C
Hgb A1c MFr Bld: 5.5 % (ref <5.7)
Mean Plasma Glucose: 111 mg/dL (ref <117)

## 2014-07-06 LAB — URINALYSIS, MICROSCOPIC ONLY
Bacteria, UA: NONE SEEN
CASTS: NONE SEEN
Crystals: NONE SEEN
Squamous Epithelial / HPF: NONE SEEN

## 2014-07-06 LAB — LIPID PANEL
CHOL/HDL RATIO: 3.1 ratio
CHOLESTEROL: 140 mg/dL (ref 0–200)
HDL: 45 mg/dL (ref >39)
LDL Cholesterol: 77 mg/dL (ref 0–99)
TRIGLYCERIDES: 90 mg/dL (ref <150)
VLDL: 18 mg/dL (ref 0–40)

## 2014-07-06 LAB — HEPATITIS B SURFACE ANTIBODY,QUALITATIVE: Hep B S Ab: NEGATIVE

## 2014-07-06 LAB — BASIC METABOLIC PANEL WITH GFR
BUN: 19 mg/dL (ref 6–23)
CO2: 22 meq/L (ref 19–32)
CREATININE: 0.93 mg/dL (ref 0.50–1.35)
Calcium: 9.5 mg/dL (ref 8.4–10.5)
Chloride: 102 mEq/L (ref 96–112)
Glucose, Bld: 85 mg/dL (ref 70–99)
Potassium: 3.9 mEq/L (ref 3.5–5.3)
SODIUM: 141 meq/L (ref 135–145)

## 2014-07-06 LAB — HIV ANTIBODY (ROUTINE TESTING W REFLEX): HIV 1&2 Ab, 4th Generation: NONREACTIVE

## 2014-07-06 LAB — HEPATITIS A ANTIBODY, TOTAL: Hep A Total Ab: NONREACTIVE

## 2014-07-06 LAB — HEPATIC FUNCTION PANEL
ALT: 42 U/L (ref 0–53)
AST: 25 U/L (ref 0–37)
Albumin: 4.6 g/dL (ref 3.5–5.2)
Alkaline Phosphatase: 59 U/L (ref 39–117)
BILIRUBIN DIRECT: 0.1 mg/dL (ref 0.0–0.3)
BILIRUBIN TOTAL: 0.4 mg/dL (ref 0.2–1.2)
Indirect Bilirubin: 0.3 mg/dL (ref 0.2–1.2)
Total Protein: 7.6 g/dL (ref 6.0–8.3)

## 2014-07-06 LAB — HEPATITIS C ANTIBODY: HCV Ab: NEGATIVE

## 2014-07-06 LAB — HEPATITIS B CORE ANTIBODY, TOTAL: Hep B Core Total Ab: NONREACTIVE

## 2014-07-06 LAB — MICROALBUMIN / CREATININE URINE RATIO
Creatinine, Urine: 157.7 mg/dL
Microalb Creat Ratio: 3.2 mg/g (ref 0.0–30.0)
Microalb, Ur: 0.5 mg/dL (ref 0.00–1.89)

## 2014-07-06 LAB — VITAMIN D 25 HYDROXY (VIT D DEFICIENCY, FRACTURES): VIT D 25 HYDROXY: 49 ng/mL (ref 30–89)

## 2014-07-06 LAB — RPR

## 2014-07-06 LAB — TSH: TSH: 3.257 u[IU]/mL (ref 0.350–4.500)

## 2014-07-06 LAB — INSULIN, FASTING: Insulin fasting, serum: 13 u[IU]/mL (ref 3–28)

## 2014-07-06 LAB — VITAMIN B12: Vitamin B-12: 762 pg/mL (ref 211–911)

## 2014-07-06 LAB — TESTOSTERONE: Testosterone: 398 ng/dL (ref 300–890)

## 2014-07-06 LAB — MAGNESIUM: Magnesium: 2.1 mg/dL (ref 1.5–2.5)

## 2014-07-07 LAB — HEPATITIS B E ANTIBODY: Hepatitis Be Antibody: NONREACTIVE

## 2014-07-08 LAB — TB SKIN TEST
Induration: 0 mm
TB SKIN TEST: NEGATIVE

## 2014-07-11 ENCOUNTER — Emergency Department (HOSPITAL_COMMUNITY)
Admission: EM | Admit: 2014-07-11 | Discharge: 2014-07-11 | Disposition: A | Discharge status: Home / Self Care | Payer: 59 | Attending: Emergency Medicine | Admitting: Emergency Medicine

## 2014-07-11 ENCOUNTER — Encounter (HOSPITAL_COMMUNITY): Payer: Self-pay | Admitting: Emergency Medicine

## 2014-07-11 DIAGNOSIS — T63461A Toxic effect of venom of wasps, accidental (unintentional), initial encounter: Secondary | ICD-10-CM | POA: Insufficient documentation

## 2014-07-11 DIAGNOSIS — T63441A Toxic effect of venom of bees, accidental (unintentional), initial encounter: Secondary | ICD-10-CM

## 2014-07-11 DIAGNOSIS — Y939 Activity, unspecified: Secondary | ICD-10-CM | POA: Insufficient documentation

## 2014-07-11 DIAGNOSIS — T6391XA Toxic effect of contact with unspecified venomous animal, accidental (unintentional), initial encounter: Secondary | ICD-10-CM | POA: Insufficient documentation

## 2014-07-11 DIAGNOSIS — R000 Tachycardia, unspecified: Secondary | ICD-10-CM | POA: Insufficient documentation

## 2014-07-11 DIAGNOSIS — Y929 Unspecified place or not applicable: Secondary | ICD-10-CM | POA: Insufficient documentation

## 2014-07-11 DIAGNOSIS — Z79899 Other long term (current) drug therapy: Secondary | ICD-10-CM | POA: Insufficient documentation

## 2014-07-11 DIAGNOSIS — IMO0002 Reserved for concepts with insufficient information to code with codable children: Secondary | ICD-10-CM | POA: Insufficient documentation

## 2014-07-11 MED ORDER — DIPHENHYDRAMINE HCL 50 MG/ML IJ SOLN: 25.0000 mg | Freq: Once | INTRAMUSCULAR | Status: DC

## 2014-07-11 MED ORDER — DIPHENHYDRAMINE HCL 25 MG PO TABS: 25.0000 mg | ORAL_TABLET | Freq: Four times a day (QID) | ORAL | Status: DC

## 2014-07-11 MED ORDER — EPINEPHRINE 0.3 MG/0.3ML IJ SOAJ
0.3000 mg | Freq: Once | INTRAMUSCULAR | Status: AC
  Administered 2014-07-11: 0.3 mg via INTRAMUSCULAR
  Filled 2014-07-11: qty 0.3

## 2014-07-11 MED ORDER — METHYLPREDNISOLONE SODIUM SUCC 125 MG IJ SOLR
125.0000 mg | Freq: Once | INTRAMUSCULAR | Status: DC
Start: 2014-07-11 — End: 2014-07-11

## 2014-07-11 MED ORDER — PREDNISONE 10 MG PO TABS: 20.0000 mg | ORAL_TABLET | Freq: Every day | ORAL | Status: DC

## 2014-07-11 MED ORDER — PREDNISONE 20 MG PO TABS
60.0000 mg | ORAL_TABLET | Freq: Once | ORAL | Status: AC
  Administered 2014-07-11: 60 mg via ORAL
  Filled 2014-07-11: qty 3

## 2014-07-11 MED ORDER — FAMOTIDINE 20 MG PO TABS
20.0000 mg | ORAL_TABLET | Freq: Once | ORAL | Status: AC
  Administered 2014-07-11: 20 mg via ORAL
  Filled 2014-07-11: qty 1

## 2014-07-11 MED ORDER — FAMOTIDINE 20 MG PO TABS: 20.0000 mg | ORAL_TABLET | Freq: Two times a day (BID) | ORAL | Status: DC

## 2014-07-11 MED ORDER — SODIUM CHLORIDE 0.9 % IV BOLUS (SEPSIS): 1000.0000 mL | Freq: Once | INTRAVENOUS | Status: DC

## 2014-07-11 MED ORDER — FAMOTIDINE IN NACL 20-0.9 MG/50ML-% IV SOLN: 20.0000 mg | Freq: Once | INTRAVENOUS | Status: DC

## 2014-07-11 NOTE — ED Provider Notes (Signed)
Medical screening examination/treatment/procedure(s) were conducted as a shared visit with non-physician practitioner(s) and myself.  I personally evaluated the patient during the encounter.  Appears to be localized lip facial reaction only, however since reaction is on face and patient refuses Benadryl, EpiPen in ED appears reasonable.   Hurman HornJohn M Camyra Vaeth, MD 07/11/14 2212

## 2014-07-11 NOTE — Discharge Instructions (Signed)
Angioedema °Angioedema is a sudden swelling of tissues, often of the skin. It can occur on the face or genitals or in the abdomen or other body parts. The swelling usually develops over a short period and gets better in 24 to 48 hours. It often begins during the night and is found when the person wakes up. The person may also get red, itchy patches of skin (hives). Angioedema can be dangerous if it involves swelling of the air passages.  °Depending on the cause, episodes of angioedema may only happen once, come back in unpredictable patterns, or repeat for several years and then gradually fade away.  °CAUSES  °Angioedema can be caused by an allergic reaction to various triggers. It can also result from nonallergic causes, including reactions to drugs, immune system disorders, viral infections, or an abnormal gene that is passed to you from your parents (hereditary). For some people with angioedema, the cause is unknown.  °Some things that can trigger angioedema include:  °· Foods.   °· Medicines, such as ACE inhibitors, ARBs, nonsteroidal anti-inflammatory agents, or estrogen.   °· Latex.   °· Animal saliva.   °· Insect stings.   °· Dyes used in X-rays.   °· Mild injury.   °· Dental work. °· Surgery. °· Stress.   °· Sudden changes in temperature.   °· Exercise. °SIGNS AND SYMPTOMS  °· Swelling of the skin. °· Hives. If these are present, there is also intense itching. °· Redness in the affected area.   °· Pain in the affected area. °· Swollen lips or tongue. °· Breathing problems. This may happen if the air passages swell. °· Wheezing. °If internal organs are involved, there may be:  °· Nausea.   °· Abdominal pain.   °· Vomiting.   °· Difficulty swallowing.   °· Difficulty passing urine. °DIAGNOSIS  °· Your health care provider will examine the affected area and take a medical and family history. °· Various tests may be done to help determine the cause. Tests may include: °¨ Allergy skin tests to see if the problem  is an allergic reaction.   °¨ Blood tests to check for hereditary angioedema.   °¨ Tests to check for underlying diseases that could cause the condition.   °· A review of your medicines, including over-the-counter medicines, may be done. °TREATMENT  °Treatment will depend on the cause of the angioedema. Possible treatments include:  °· Removal of anything that triggered the condition (such as stopping certain medicines).   °· Medicines to treat symptoms or prevent attacks. Medicines given may include:   °¨ Antihistamines.   °¨ Epinephrine injection.   °¨ Steroids.   °· Hospitalization may be required for severe attacks. If the air passages are affected, it can be an emergency. Tubes may need to be placed to keep the airway open. °HOME CARE INSTRUCTIONS  °· Take all medicines as directed by your health care provider. °· If you were given medicines for emergency allergy treatment, always carry them with you. °· Wear a medical bracelet as directed by your health care provider.   °· Avoid known triggers. °SEEK MEDICAL CARE IF:  °· You have repeat attacks of angioedema.   °· Your attacks are more frequent or more severe despite preventive measures.   °· You have hereditary angioedema and are considering having children. It is important to discuss with your health care provider the risks of passing the condition on to your children. °SEEK IMMEDIATE MEDICAL CARE IF:  °· You have severe swelling of the mouth, tongue, or lips. °· You have difficulty breathing.   °· You have difficulty swallowing.   °· You faint. °MAKE   SURE YOU: °· Understand these instructions. °· Will watch your condition. °· Will get help right away if you are not doing well or get worse. °Document Released: 02/10/2002 Document Revised: 04/18/2014 Document Reviewed: 07/26/2013 °[employer]® Patient Information ©2015 [employer]. This information is not intended to replace advice given to you by your health care provider. Make sure you discuss any questions  you have with your health care provider. ° °Bee, Wasp, or Hornet Sting °Your caregiver has diagnosed you as having an insect sting. An insect sting appears as a red lump in the skin that sometimes has a tiny hole in the center, or it may have a stinger in the center of the wound. The most common stings are from wasps, hornets and bees. °Individuals have different reactions to insect stings. °· A normal reaction may cause pain, swelling, and redness around the sting site. °· A localized allergic reaction may cause swelling and redness that extends beyond the sting site. °· A large local reaction may continue to develop over the next 12 to 36 hours. °· On occasion, the reactions can be severe (anaphylactic reaction). An anaphylactic reaction may cause wheezing; difficulty breathing; chest pain; fainting; raised, itchy, red patches on the skin; a sick feeling to your stomach (nausea); vomiting; cramping; or diarrhea. If you have had an anaphylactic reaction to an insect sting in the past, you are more likely to have one again. °HOME CARE INSTRUCTIONS  °· With bee stings, a small sac of poison is left in the wound. Brushing across this with something such as a credit card, or anything similar, will help remove this and decrease the amount of the reaction. This same procedure will not help a wasp sting as they do not leave behind a stinger and poison sac. °· Apply a cold compress for 10 to 20 minutes every hour for 1 to 2 days, depending on severity, to reduce swelling and itching. °· To lessen pain, a paste made of water and baking soda may be rubbed on the bite or sting and left on for 5 minutes. °· To relieve itching and swelling, you may use take medication or apply medicated creams or lotions as directed. °· Only take over-the-counter or prescription medicines for pain, discomfort, or fever as directed by your caregiver. °· Wash the sting site daily with soap and water. Apply antibiotic ointment on the sting site as  directed. °· If you suffered a severe reaction: °¨ If you did not require hospitalization, an adult will need to stay with you for 24 hours in case the symptoms return. °¨ You may need to wear a medical bracelet or necklace stating the allergy. °¨ You and your family need to learn when and how to use an anaphylaxis kit or epinephrine injection. °¨ If you have had a severe reaction before, always carry your anaphylaxis kit with you. °SEEK MEDICAL CARE IF:  °· None of the above helps within 2 to 3 days. °· The area becomes red, warm, tender, and swollen beyond the area of the bite or sting. °· You have an oral temperature above 102° F (38.9° C). °SEEK IMMEDIATE MEDICAL CARE IF:  °You have symptoms of an allergic reaction which are: °· Wheezing. °· Difficulty breathing. °· Chest pain. °· Lightheadedness or fainting. °· Itchy, raised, red patches on the skin. °· Nausea, vomiting, cramping or diarrhea. °ANY OF THESE SYMPTOMS MAY REPRESENT A SERIOUS PROBLEM THAT IS AN EMERGENCY. Do not wait to see if the symptoms will go away.   Get medical help right away. Call your local emergency services (911 in [place] DO NOT drive yourself to the hospital. MAKE SURE YOU:   Understand these instructions.  Will watch your condition.  Will get help right away if you are not doing well or get worse. Document Released: 12/02/2005 Document Revised: 02/24/2012 Document Reviewed: 05/19/2010 [facility] Patient Information 2015 [place], [place]. This information is not intended to replace advice given to you by your health care provider. Make sure you discuss any questions you have with your health care provider.

## 2014-07-11 NOTE — ED Provider Notes (Signed)
CSN: [id number]     Arrival date & time 07/11/14  1138 History   First MD Initiated Contact with Patient 07/11/14 1201     Chief Complaint  Patient presents with  . Allergic Reaction     (Consider location/radiation/quality/duration/timing/severity/associated sxs/prior Treatment) HPI  Patient to the ED with complaints of bee sting to lower lip. 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 His lip has swollen up significantly. He does admit that it has been slowly feeling tighter and tighter. He has not had any swelling of the tongue, throat, no SOB, wheezing, or rash. He admits to being anxious but it because he doesn't like hospitals.    History reviewed. No pertinent past medical history. Past Surgical History  Procedure Laterality Date  . Appendectomy    . Shoulder surgery      pin in left shoulder   Family History  Problem Relation Age of Onset  . Diabetes Father   . Heart disease Father    History  Substance Use Topics  . Smoking status: Never Smoker   . Smokeless tobacco: Current User    Types: Chew  . Alcohol Use: Yes     Comment: ocassional    Review of Systems   Review of Systems  Gen: no weight loss, fevers, chills, night sweats  Eyes: no discharge or drainage, no occular pain or visual changes  Nose: no epistaxis or rhinorrhea  Mouth: no dental pain, no sore throat  + bottom lip swelling Neck: no neck pain  Lungs:No wheezing or hemoptysis No coughing CV:  No palpitations, dependent edema or orthopnea. No chest pain Abd: no diarrhea. No nausea or vomiting, No abdominal pain  GU: no dysuria or gross hematuria  MSK:  No muscle weakness, No  pain Neuro: no headache, no focal neurologic deficits  Skin: no rash , no wounds Psyche: no complaints    Allergies  Review of patient's allergies indicates no known allergies.  Home Medications   Prior to Admission medications   Medication Sig Start Date End Date Taking? Authorizing Provider   diphenhydrAMINE (BENADRYL) 25 MG tablet Take 1 tablet (25 mg total) by mouth every 6 (six) hours. 07/11/14   Karmon Andis Irine Seal, PA-C  famotidine (PEPCID) 20 MG tablet Take 1 tablet (20 mg total) by mouth 2 (two) times daily. 07/11/14   Lui Bellis Irine Seal, PA-C  predniSONE (DELTASONE) 10 MG tablet Take 2 tablets (20 mg total) by mouth daily. 07/11/14   Jakaria Lavergne Irine Seal, PA-C   BP 122/62  Pulse 95  Temp(Src) 98.5 F (36.9 C) (Oral)  Resp 15  Ht 6\' 2"  (1.88 m)  Wt 245 lb (111.131 kg)  BMI 31.44 kg/m2  SpO2 98% Physical Exam  Nursing note and vitals reviewed. Constitutional: He appears well-developed and well-nourished. No distress.  HENT:  Head: Normocephalic and atraumatic. Head is without right periorbital erythema and without left periorbital erythema.  Nose: Nose normal.  Mouth/Throat: Uvula is midline, oropharynx is clear and moist and mucous membranes are normal.  Significant swelling to entire bottom lip, no intra oral involvement.  Eyes: Pupils are equal, round, and reactive to light.  Neck: Normal range of motion. Neck supple. No tracheal tenderness present. No rigidity.  Cardiovascular: Regular rhythm.  Tachycardia present.   Pulmonary/Chest: Effort normal and breath sounds normal. No accessory muscle usage or stridor. Not tachypneic. No respiratory distress.  Abdominal: Soft.  Neurological: He is alert.  Skin: Skin is warm and dry. No  rash noted.    ED Course  Procedures (including critical care time) Labs Review Labs Reviewed - No data to display  Imaging Review No results found.   EKG Interpretation None      MDM   Final diagnoses:  Bee sting, accidental or unintentional, initial encounter   12:18 pm I asked Dr. Fonnie JarvisBednar to see the patient after my examination as I think he may benefit from a dose of epinephrine.  The patient would like to go back to work after leaving the hospital and prefers no Benadryl or IV. Dr. Fonnie JarvisBednar and him discussed taking PO 60 mg  Prednisone, PO 20 mg Pepcid and IM Epi. Then he will be monitored for at least an hour.   1:41 pm Swelling is significantly improved. Patient has had no airway involvement during his stay and is ready to go home. diphenhydrAMINE (BENADRYL) 25 MG tablet Take 1 tablet (25 mg total) by mouth every 6 (six) hours. 20 tablet Dorthula Matasiffany G Markeda Narvaez, PA-C   famotidine (PEPCID) 20 MG tablet Take 1 tablet (20 mg total) by mouth 2 (two) times daily. 30 tablet Dorthula Matasiffany G Shalandria Elsbernd, PA-C   predniSONE (DELTASONE) 10 MG tablet Take 2 tablets (20 mg total) by mouth daily. 21 tablet Dorthula Matasiffany G Future Yeldell, PA-C       23 y.o.Michael Crane's evaluation in the Emergency Department is complete. It has been determined that no acute conditions requiring further emergency intervention are present at this time. The patient/guardian have been advised of the diagnosis and plan. We have discussed signs and symptoms that warrant return to the ED, such as changes or worsening in symptoms.  Vital signs are stable at discharge. Filed Vitals:   07/11/14 1230  BP: 122/62  Pulse: 95  Temp:   Resp: 15    Patient/guardian has voiced understanding and agreed to follow-up with the PCP or specialist.   Dorthula Matasiffany G Bary Limbach, PA-C 07/11/14 1342

## 2014-07-11 NOTE — ED Notes (Signed)
Per pt sts he was working outside and stung by a bee on the lip. Pt has significant swelling to bottom lip and sts his tongue feels like its swelling. Denies any trouble breathing.

## 2014-08-17 ENCOUNTER — Emergency Department (HOSPITAL_COMMUNITY)
Admission: EM | Admit: 2014-08-17 | Discharge: 2014-08-17 | Disposition: A | Discharge status: Home / Self Care | Payer: 59 | Attending: Emergency Medicine | Admitting: Emergency Medicine

## 2014-08-17 ENCOUNTER — Encounter (HOSPITAL_COMMUNITY): Payer: Self-pay | Admitting: Emergency Medicine

## 2014-08-17 ENCOUNTER — Emergency Department (HOSPITAL_COMMUNITY): Payer: 59

## 2014-08-17 DIAGNOSIS — M25569 Pain in unspecified knee: Secondary | ICD-10-CM | POA: Insufficient documentation

## 2014-08-17 DIAGNOSIS — M791 Myalgia, unspecified site: Secondary | ICD-10-CM

## 2014-08-17 DIAGNOSIS — B349 Viral infection, unspecified: Secondary | ICD-10-CM

## 2014-08-17 DIAGNOSIS — R51 Headache: Secondary | ICD-10-CM | POA: Diagnosis not present

## 2014-08-17 DIAGNOSIS — H5710 Ocular pain, unspecified eye: Secondary | ICD-10-CM | POA: Insufficient documentation

## 2014-08-17 DIAGNOSIS — M2550 Pain in unspecified joint: Secondary | ICD-10-CM

## 2014-08-17 DIAGNOSIS — R509 Fever, unspecified: Secondary | ICD-10-CM | POA: Insufficient documentation

## 2014-08-17 DIAGNOSIS — R079 Chest pain, unspecified: Secondary | ICD-10-CM | POA: Diagnosis not present

## 2014-08-17 DIAGNOSIS — B9789 Other viral agents as the cause of diseases classified elsewhere: Secondary | ICD-10-CM | POA: Diagnosis not present

## 2014-08-17 DIAGNOSIS — R519 Headache, unspecified: Secondary | ICD-10-CM

## 2014-08-17 LAB — BASIC METABOLIC PANEL
Anion gap: 13 (ref 5–15)
BUN: 14 mg/dL (ref 6–23)
CALCIUM: 8.9 mg/dL (ref 8.4–10.5)
CO2: 24 mEq/L (ref 19–32)
Chloride: 102 mEq/L (ref 96–112)
Creatinine, Ser: 0.89 mg/dL (ref 0.50–1.35)
GLUCOSE: 93 mg/dL (ref 70–99)
POTASSIUM: 3.8 meq/L (ref 3.7–5.3)
SODIUM: 139 meq/L (ref 137–147)

## 2014-08-17 LAB — CBC
HCT: 41.9 % (ref 39.0–52.0)
HEMOGLOBIN: 14.8 g/dL (ref 13.0–17.0)
MCH: 29.4 pg (ref 26.0–34.0)
MCHC: 35.3 g/dL (ref 30.0–36.0)
MCV: 83.3 fL (ref 78.0–100.0)
PLATELETS: 183 10*3/uL (ref 150–400)
RBC: 5.03 MIL/uL (ref 4.22–5.81)
RDW: 12.9 % (ref 11.5–15.5)
WBC: 7.6 10*3/uL (ref 4.0–10.5)

## 2014-08-17 LAB — I-STAT TROPONIN, ED: TROPONIN I, POC: 0 ng/mL (ref 0.00–0.08)

## 2014-08-17 MED ORDER — SODIUM CHLORIDE 0.9 % IV BOLUS (SEPSIS)
1000.0000 mL | Freq: Once | INTRAVENOUS | Status: AC
  Administered 2014-08-17: 1000 mL via INTRAVENOUS

## 2014-08-17 MED ORDER — KETOROLAC TROMETHAMINE 30 MG/ML IJ SOLN
30.0000 mg | Freq: Once | INTRAMUSCULAR | Status: AC
  Administered 2014-08-17: 30 mg via INTRAVENOUS
  Filled 2014-08-17: qty 1

## 2014-08-17 MED ORDER — ACETAMINOPHEN 500 MG PO TABS
1000.0000 mg | ORAL_TABLET | Freq: Once | ORAL | Status: AC
  Administered 2014-08-17: 1000 mg via ORAL
  Filled 2014-08-17: qty 2

## 2014-08-17 MED ORDER — ALBUTEROL SULFATE (2.5 MG/3ML) 0.083% IN NEBU
5.0000 mg | INHALATION_SOLUTION | Freq: Once | RESPIRATORY_TRACT | Status: AC
  Administered 2014-08-17: 5 mg via RESPIRATORY_TRACT
  Filled 2014-08-17: qty 6

## 2014-08-17 NOTE — ED Provider Notes (Signed)
CSN: [id number]     Arrival date & time 08/17/14  1847 History   First MD Initiated Contact with Patient 08/17/14 2014     Chief Complaint  Patient presents with  . Chest Pain    The patient said he started having chest pain about 1430hrs this afternoon.  He says his pain is intermittent and went away and came back.    . Fever  . Cough  . Muscle Pain     (Consider location/radiation/quality/duration/timing/severity/associated sxs/prior Treatment) HPI Patient presents to the emergency department with multiple complaints. Patient states that yesterday he was bitten on the hip by an insect. Wednesday he states he is feeling unwell and had a slight headache. He took Motrin and was able to get to sleep. Today around 1:30 his symptoms acutely worsened. He complains of chills, myalgias, arthralgias. He has a tickle in his chest, cough, and chest tightness which he states feels like a muscle spasm. He also c/o of severe headache, eye pain. He denies neck stiffness, rash. He has mild sensitivity to light. He has not been vaccicnated for viral meningitis.    History reviewed. No pertinent past medical history. Past Surgical History  Procedure Laterality Date  . Appendectomy    . Shoulder surgery      pin in left shoulder   Family History  Problem Relation Age of Onset  . Diabetes Father   . Heart disease Father    History  Substance Use Topics  . Smoking status: Never Smoker   . Smokeless tobacco: Current User    Types: Chew  . Alcohol Use: Yes     Comment: ocassional    Review of Systems  Ten systems reviewed and are negative for acute change, except as noted in the HPI.    Allergies  Review of patient's allergies indicates no known allergies.  Home Medications   Prior to Admission medications   Medication Sig Start Date End Date Taking? Authorizing Provider  diphenhydrAMINE (BENADRYL) 25 MG tablet Take 1 tablet (25 mg total) by mouth every 6 (six) hours. 07/11/14  Yes  Tiffany Irine Seal, PA-C  ibuprofen (ADVIL,MOTRIN) 200 MG tablet Take 600 mg by mouth every 6 (six) hours as needed for mild pain.   Yes Historical Provider, MD   BP 144/66  Pulse 99  Temp(Src) 99.4 F (37.4 C) (Oral)  Resp 16  Ht  (1.88 m)  Wt 250 lb (113.399 kg)  BMI 32.08 kg/m2  SpO2 99% Physical Exam Appears moderately ill but not toxic; temperature as noted in vitals. Ears normal. Eyes:glassy,red appearance, no discharge, perrl, eomi Heart: tachycardic, NO M/G/R Throat and pharynx normal.   Neck supple. No adenopathyhy in the neck.  Sinuses non tender.  The chest is clear. Dry cough. Nontender Abdomen is soft and nontender There is a 2 cm lesion on the left hip consistent with insect bite. Erythematous complexion, hot to touch Neuro: No meningismus, negative Kernings  ED Course  Procedures (including critical care time) Labs Review Labs Reviewed  CBC  BASIC METABOLIC PANEL  I-STAT TROPOININ, ED    Imaging Review No results found.   EKG Interpretation   Date/Time:  Wednesday August 17 2014 18:52:34 EDT Ventricular Rate:  114 PR Interval:  122 QRS Duration: 74 QT Interval:  284 QTC Calculation: 391 R Axis:   60 Text Interpretation:  Sinus tachycardia Nonspecific T wave abnormality  Artifact No old tracing to compare Confirmed by [facility]  MD, Nicholos Johns  [phone]) on 08/17/2014 8:58:21 PM  MDM   Final diagnoses:  None    9:16 PM  I took the patient;s oral temp and found it to be 101.8  I feel the patient's sxs are most consistent with an impendig viral infection given his fever, myalgias, arthralgis.  Labs are normal. EKG is unremarkable.  Treat with fluids, 1000 tylenol, IV toradol, and albuterol    11:07 PM BP 119/43  Pulse 109  Temp(Src) 101.1 F (38.4 C) (Oral)  Resp 24  Ht  (1.88 m)  Wt 250 lb (113.399 kg)  BMI 32.08 kg/m2  SpO2 97% Pateint  Fever rechecked  And 99.4 on my exam.; He still has some milde myalgia  And  arthralgia in the legs, but her denies andy headache, neck stiffness, photophobia/phonophobia and recheck shows no meningismus. He states that he is feeling much better. Will discharge with strong warning precautions. He should follow up with his pcp.  I personally reviewed the imaging tests through PACS system. I have reviewed and interpreted Lab values. I reviewed available ER/hospitalization records through the EMR      Arthor Captain, [place] 08/17/14 2309

## 2014-08-17 NOTE — ED Notes (Addendum)
The patient said he started having chest pain about 1430hrs this afternoon.  He says his pain is intermittent and went away and came back.  The patient said he was bitten by a spider but her does not know what kind of spider it was.  He says he remembers picking up his little boy and driving home and he does not remember after that.  He says he woke up with the chest pain and they brought him here.  He also added that when the chest pain comes his legs "cramp up".

## 2014-08-17 NOTE — Discharge Instructions (Signed)
You are having a headache. No specific cause was found today for your headache. It may have been a migraine or other cause of headache. Stress, anxiety, fatigue, and depression are common triggers for headaches. Your headache today does not appear to be life-threatening or require hospitalization, but often the exact cause of headaches is not determined in the emergency department. Therefore, follow-up with your doctor is very important to find out what may have caused your headache, and whether or not you need any further diagnostic testing or treatment. Sometimes headaches can appear benign (not harmful), but then more serious symptoms can develop which should prompt an immediate re-evaluation by your doctor or the emergency department. SEEK MEDICAL ATTENTION IF: You develop possible problems with medications prescribed.  The medications don't resolve your headache, if it recurs , or if you have multiple episodes of vomiting or can't take fluids. You have a change from the usual headache. RETURN IMMEDIATELY IF you develop a sudden, severe headache or confusion, become poorly responsive or faint, develop a fever above 100.59F or problem breathing, have a change in speech, vision, swallowing, or understanding, or develop new weakness, numbness, tingling, incoordination, or have a seizure.   Fever, Adult A fever is a higher than normal body temperature. In an adult, an oral temperature around 98.6 F (37 C) is considered normal. A temperature of 100.4 F (38 C) or higher is generally considered a fever. Mild or moderate fevers generally have no long-term effects and often do not require treatment. Extreme fever (greater than or equal to 106 F or 41.1 C) can cause seizures. The sweating that may occur with repeated or prolonged fever may cause dehydration. Elderly people can develop confusion during a fever. A measured temperature can vary with:  Age.  Time of day.  Method of measurement (mouth,  underarm, rectal, or ear). The fever is confirmed by taking a temperature with a thermometer. Temperatures can be taken different ways. Some methods are accurate and some are not.  An oral temperature is used most commonly. Electronic thermometers are fast and accurate.  An ear temperature will only be accurate if the thermometer is positioned as recommended by the manufacturer.  A rectal temperature is accurate and done for those adults who have a condition where an oral temperature cannot be taken.  An underarm (axillary) temperature is not accurate and not recommended. Fever is a symptom, not a disease.  CAUSES   Infections commonly cause fever.  Some noninfectious causes for fever include:  Some arthritis conditions.  Some thyroid or adrenal gland conditions.  Some immune system conditions.  Some types of cancer.  A medicine reaction.  High doses of certain street drugs such as methamphetamine.  Dehydration.  Exposure to high outside or room temperatures.  Occasionally, the source of a fever cannot be determined. This is sometimes called a "fever of unknown origin" (FUO).  Some situations may lead to a temporary rise in body temperature that may go away on its own. Examples are:  Childbirth.  Surgery.  Intense exercise. HOME CARE INSTRUCTIONS   Take appropriate medicines for fever. Follow dosing instructions carefully. If you use acetaminophen to reduce the fever, be careful to avoid taking other medicines that also contain acetaminophen. Do not take aspirin for a fever if you are younger than age 62. There is an association with Reye's syndrome. Reye's syndrome is a rare but potentially deadly disease.  If an infection is present and antibiotics have been prescribed, take them as directed.  Finish them even if you start to feel better.  Rest as needed.  Maintain an adequate fluid intake. To prevent dehydration during an illness with prolonged or recurrent fever,  you may need to drink extra fluid.Drink enough fluids to keep your urine clear or pale yellow.  Sponging or bathing with room temperature water may help reduce body temperature. Do not use ice water or alcohol sponge baths.  Dress comfortably, but do not over-bundle. SEEK MEDICAL CARE IF:   You are unable to keep fluids down.  You develop vomiting or diarrhea.  You are not feeling at least partly better after 3 days.  You develop new symptoms or problems. SEEK IMMEDIATE MEDICAL CARE IF:   You have shortness of breath or trouble breathing.  You develop excessive weakness.  You are dizzy or you faint.  You are extremely thirsty or you are making little or no urine.  You develop new pain that was not there before (such as in the head, neck, chest, back, or abdomen).  You have persistent vomiting and diarrhea for more than 1 to 2 days.  You develop a stiff neck or your eyes become sensitive to light.  You develop a skin rash.  You have a fever or persistent symptoms for more than 2 to 3 days.  You have a fever and your symptoms suddenly get worse. MAKE SURE YOU:   Understand these instructions.  Will watch your condition.  Will get help right away if you are not doing well or get worse. Document Released: 05/28/2001 Document Revised: 04/18/2014 Document Reviewed: 10/03/2011 [facility] Patient Information 2015 [place], [place]. This information is not intended to replace advice given to you by your health care provider. Make sure you discuss any questions you have with your health care provider.   Viral Infections A viral infection can be caused by different types of viruses.Most viral infections are not serious and resolve on their own. However, some infections may cause severe symptoms and may lead to further complications. SYMPTOMS Viruses can frequently cause:  Minor sore throat.  Aches and pains.  Headaches.  Runny nose.  Different types of rashes.  Watery  eyes.  Tiredness.  Cough.  Loss of appetite.  Gastrointestinal infections, resulting in nausea, vomiting, and diarrhea. These symptoms do not respond to antibiotics because the infection is not caused by bacteria. However, you might catch a bacterial infection following the viral infection. This is sometimes called a "superinfection." Symptoms of such a bacterial infection may include:  Worsening sore throat with pus and difficulty swallowing.  Swollen neck glands.  Chills and a high or persistent fever.  Severe headache.  Tenderness over the sinuses.  Persistent overall ill feeling (malaise), muscle aches, and tiredness (fatigue).  Persistent cough.  Yellow, green, or brown mucus production with coughing. HOME CARE INSTRUCTIONS   Only take over-the-counter or prescription medicines for pain, discomfort, diarrhea, or fever as directed by your caregiver.  Drink enough water and fluids to keep your urine clear or pale yellow. Sports drinks can provide valuable electrolytes, sugars, and hydration.  Get plenty of rest and maintain proper nutrition. Soups and broths with crackers or rice are fine. SEEK IMMEDIATE MEDICAL CARE IF:   You have severe headaches, shortness of breath, chest pain, neck pain, or an unusual rash.  You have uncontrolled vomiting, diarrhea, or you are unable to keep down fluids.  You or your child has an oral temperature above 102 F (38.9 C), not controlled by medicine.  Your baby  is older than 3 months with a rectal temperature of 102 F (38.9 C) or higher.  Your baby is 11 months old or younger with a rectal temperature of 100.4 F (38 C) or higher. MAKE SURE YOU:   Understand these instructions.  Will watch your condition.  Will get help right away if you are not doing well or get worse. Document Released: 09/11/2005 Document Revised: 02/24/2012 Document Reviewed: 04/08/2011 [facility] Patient Information 2015 [place], [place]. This information  is not intended to replace advice given to you by your health care provider. Make sure you discuss any questions you have with your health care provider.

## 2014-08-19 NOTE — ED Provider Notes (Signed)
Medical screening examination/treatment/procedure(s) were performed by non-physician practitioner and as supervising physician I was immediately available for consultation/collaboration.   EKG Interpretation   Date/Time:  Wednesday August 17 2014 18:52:34 EDT Ventricular Rate:  114 PR Interval:  122 QRS Duration: 74 QT Interval:  284 QTC Calculation: 391 R Axis:   60 Text Interpretation:  Sinus tachycardia Nonspecific T wave abnormality  Artifact No old tracing to compare Confirmed by [facility]  MD, Nicholos Johns  [phone]) on 08/17/2014 8:58:21 PM         EKG Interpretation   Date/Time:  Wednesday August 17 2014 18:52:34 EDT Ventricular Rate:  114 PR Interval:  122 QRS Duration: 74 QT Interval:  284 QTC Calculation: 391 R Axis:   60 Text Interpretation:  Sinus tachycardia Nonspecific T wave abnormality  Artifact No old tracing to compare Confirmed by [facility]  MD, Nicholos Johns  [phone]) on 08/17/2014 8:58:21 PM        Samuel Jester, DO 08/19/14 2007

## 2015-03-23 ENCOUNTER — Other Ambulatory Visit: Payer: Self-pay | Admitting: Physician Assistant

## 2015-03-23 DIAGNOSIS — M25512 Pain in left shoulder: Secondary | ICD-10-CM

## 2015-04-17 ENCOUNTER — Ambulatory Visit
Admission: RE | Admit: 2015-04-17 | Discharge: 2015-04-17 | Disposition: A | Discharge status: Home / Self Care | Payer: 59 | Source: Ambulatory Visit | Attending: Physician Assistant | Admitting: Physician Assistant

## 2015-04-17 ENCOUNTER — Other Ambulatory Visit: Payer: Self-pay | Admitting: Physician Assistant

## 2015-04-17 DIAGNOSIS — M25512 Pain in left shoulder: Secondary | ICD-10-CM

## 2015-04-17 MED ORDER — IOHEXOL 180 MG/ML  SOLN: 15.0000 mL | Freq: Once | INTRAMUSCULAR | Status: AC | PRN

## 2015-07-05 ENCOUNTER — Other Ambulatory Visit (HOSPITAL_COMMUNITY): Payer: Self-pay | Admitting: Orthopaedic Surgery

## 2015-07-06 ENCOUNTER — Ambulatory Visit: Payer: 59 | Admitting: Internal Medicine

## 2015-07-06 NOTE — Progress Notes (Signed)
no show

## 2015-07-12 ENCOUNTER — Encounter (HOSPITAL_COMMUNITY)
Admission: RE | Admit: 2015-07-12 | Discharge: 2015-07-12 | Disposition: A | Discharge status: Home / Self Care | Payer: 59 | Source: Ambulatory Visit | Attending: Orthopaedic Surgery | Admitting: Orthopaedic Surgery

## 2015-07-12 ENCOUNTER — Encounter (HOSPITAL_COMMUNITY): Payer: Self-pay

## 2015-07-12 DIAGNOSIS — S43492A Other sprain of left shoulder joint, initial encounter: Secondary | ICD-10-CM | POA: Diagnosis not present

## 2015-07-12 DIAGNOSIS — Z01812 Encounter for preprocedural laboratory examination: Secondary | ICD-10-CM | POA: Insufficient documentation

## 2015-07-12 DIAGNOSIS — X58XXXA Exposure to other specified factors, initial encounter: Secondary | ICD-10-CM | POA: Diagnosis not present

## 2015-07-12 LAB — CBC
HEMATOCRIT: 48.9 % (ref 39.0–52.0)
Hemoglobin: 16.4 g/dL (ref 13.0–17.0)
MCH: 29.1 pg (ref 26.0–34.0)
MCHC: 33.5 g/dL (ref 30.0–36.0)
MCV: 86.9 fL (ref 78.0–100.0)
PLATELETS: 275 10*3/uL (ref 150–400)
RBC: 5.63 MIL/uL (ref 4.22–5.81)
RDW: 13.7 % (ref 11.5–15.5)
WBC: 10.1 10*3/uL (ref 4.0–10.5)

## 2015-07-12 NOTE — Pre-Procedure Instructions (Signed)
Michael Crane  07/12/2015      [facility] - [place], [place] - [address] [place] [address] Phone: [phone] Fax: [phone]    Your procedure is scheduled on Thursday  07/20/15  Report to [facility] Admitting at 830 A.M.  Call this number if you have problems the morning of surgery:  [phone]   Remember:  Do not eat food or drink liquids after midnight.  Take these medicines the morning of surgery with A SIP OF WATER  NONE   Do not wear jewelry, make-up or nail polish.  Do not wear lotions, powders, or perfumes.  You may wear deodorant.  Do not shave 48 hours prior to surgery.  Men may shave face and neck.  Do not bring valuables to the hospital.  [facility] is not responsible for any belongings or valuables.  Contacts, dentures or bridgework may not be worn into surgery.  Leave your suitcase in the car.  After surgery it may be brought to your room.  For patients admitted to the hospital, discharge time will be determined by your treatment team.  Patients discharged the day of surgery will not be allowed to drive home.   Name and phone number of your driver:   Special instructions:  [place] - Preparing for Surgery  Before surgery, you can play an important role.  Because skin is not sterile, your skin needs to be as free of germs as possible.  You can reduce the number of germs on you skin by washing with CHG (chlorahexidine gluconate) soap before surgery.  CHG is an antiseptic cleaner which kills germs and bonds with the skin to continue killing germs even after washing.  Please DO NOT use if you have an allergy to CHG or antibacterial soaps.  If your skin becomes reddened/irritated stop using the CHG and inform your nurse when you arrive at Short Stay.  Do not shave (including legs and underarms) for at least 48 hours prior to the first CHG shower.  You may shave your face.  Please  follow these instructions carefully:   1.  Shower with CHG Soap the night before surgery and the                                morning of Surgery.  2.  If you choose to wash your hair, wash your hair first as usual with your       normal shampoo.  3.  After you shampoo, rinse your hair and body thoroughly to remove the                      Shampoo.  4.  Use CHG as you would any other liquid soap.  You can apply chg directly       to the skin and wash gently with scrungie or a clean washcloth.  5.  Apply the CHG Soap to your body ONLY FROM THE NECK DOWN.        Do not use on open wounds or open sores.  Avoid contact with your eyes,       ears, mouth and genitals (private parts).  Wash genitals (private parts)       with your normal soap.  6.  Wash thoroughly, paying special attention to the area where your surgery  will be performed.  7.  Thoroughly rinse your body with warm water from the neck down.  8.  DO NOT shower/wash with your normal soap after using and rinsing off       the CHG Soap.  9.  Pat yourself dry with a clean towel.            10.  Wear clean pajamas.            11.  Place clean sheets on your bed the night of your first shower and do not        sleep with pets.  Day of Surgery  Do not apply any lotions/deoderants the morning of surgery.  Please wear clean clothes to the hospital/surgery center.    Please read over the following fact sheets that you were given. Pain Booklet, Coughing and Deep Breathing and Surgical Site Infection Prevention

## 2015-07-19 ENCOUNTER — Encounter (HOSPITAL_COMMUNITY): Payer: Self-pay | Admitting: Anesthesiology

## 2015-07-19 NOTE — Progress Notes (Signed)
Instructed patient to arrive at 900 am on 07/20/15.

## 2015-07-19 NOTE — Anesthesia Preprocedure Evaluation (Addendum)
Anesthesia Evaluation  Patient identified by MRN, date of birth, ID band Patient awake    Reviewed: Allergy & Precautions, NPO status , Patient's Chart, lab work & pertinent test results  Airway Mallampati: I  TM Distance: >3 FB Neck ROM: Full    Dental  (+) Teeth Intact   Pulmonary neg pulmonary ROS,  breath sounds clear to auscultation        Cardiovascular negative cardio ROS  Rhythm:Regular Rate:Normal     Neuro/Psych negative neurological ROS     GI/Hepatic negative GI ROS, Neg liver ROS,   Endo/Other  negative endocrine ROS  Renal/GU negative Renal ROS     Musculoskeletal   Abdominal   Peds  Hematology negative hematology ROS (+)   Anesthesia Other Findings   Reproductive/Obstetrics                            Anesthesia Physical Anesthesia Plan  ASA: I  Anesthesia Plan: General and Regional   Post-op Pain Management: GA combined w/ Regional for post-op pain   Induction: Intravenous  Airway Management Planned: Oral ETT  Additional Equipment:   Intra-op Plan:   Post-operative Plan: Extubation in OR  Informed Consent: I have reviewed the patients History and Physical, chart, labs and discussed the procedure including the risks, benefits and alternatives for the proposed anesthesia with the patient or authorized representative who has indicated his/her understanding and acceptance.   Dental advisory given  Plan Discussed with: CRNA  Anesthesia Plan Comments:        Anesthesia Quick Evaluation

## 2015-07-20 ENCOUNTER — Encounter (HOSPITAL_COMMUNITY): Payer: Self-pay | Admitting: *Deleted

## 2015-07-20 ENCOUNTER — Encounter (HOSPITAL_COMMUNITY)
Admission: RE | Disposition: A | Discharge status: Home / Self Care | Payer: Self-pay | Source: Ambulatory Visit | Attending: Orthopaedic Surgery

## 2015-07-20 ENCOUNTER — Ambulatory Visit (HOSPITAL_COMMUNITY)
Admission: RE | Admit: 2015-07-20 | Discharge: 2015-07-20 | Disposition: A | Discharge status: Home / Self Care | Payer: 59 | Source: Ambulatory Visit | Attending: Orthopaedic Surgery | Admitting: Orthopaedic Surgery

## 2015-07-20 ENCOUNTER — Ambulatory Visit (HOSPITAL_COMMUNITY): Payer: 59 | Admitting: Anesthesiology

## 2015-07-20 DIAGNOSIS — S43492A Other sprain of left shoulder joint, initial encounter: Secondary | ICD-10-CM | POA: Diagnosis not present

## 2015-07-20 DIAGNOSIS — M24412 Recurrent dislocation, left shoulder: Secondary | ICD-10-CM | POA: Diagnosis present

## 2015-07-20 DIAGNOSIS — S43432A Superior glenoid labrum lesion of left shoulder, initial encounter: Secondary | ICD-10-CM

## 2015-07-20 DIAGNOSIS — F17220 Nicotine dependence, chewing tobacco, uncomplicated: Secondary | ICD-10-CM | POA: Diagnosis not present

## 2015-07-20 DIAGNOSIS — X58XXXA Exposure to other specified factors, initial encounter: Secondary | ICD-10-CM | POA: Diagnosis not present

## 2015-07-20 DIAGNOSIS — S43439A Superior glenoid labrum lesion of unspecified shoulder, initial encounter: Secondary | ICD-10-CM

## 2015-07-20 HISTORY — PX: SHOULDER ARTHROSCOPY: SHX128

## 2015-07-20 SURGERY — ARTHROSCOPY, SHOULDER
Anesthesia: Regional | Site: Shoulder | Laterality: Left

## 2015-07-20 MED ORDER — FENTANYL CITRATE (PF) 100 MCG/2ML IJ SOLN
INTRAMUSCULAR | Status: DC | PRN
  Administered 2015-07-20 (×5): 50 ug via INTRAVENOUS

## 2015-07-20 MED ORDER — OXYCODONE-ACETAMINOPHEN 5-325 MG PO TABS: 1.0000 | ORAL_TABLET | ORAL | Status: AC | PRN

## 2015-07-20 MED ORDER — FENTANYL CITRATE (PF) 100 MCG/2ML IJ SOLN
INTRAMUSCULAR | Status: AC
  Administered 2015-07-20: 100 ug
  Filled 2015-07-20: qty 2

## 2015-07-20 MED ORDER — ARTIFICIAL TEARS OP OINT
TOPICAL_OINTMENT | OPHTHALMIC | Status: AC
  Filled 2015-07-20: qty 3.5

## 2015-07-20 MED ORDER — PROMETHAZINE HCL 25 MG/ML IJ SOLN
INTRAMUSCULAR | Status: AC
  Filled 2015-07-20: qty 1

## 2015-07-20 MED ORDER — SODIUM CHLORIDE 0.9 % IR SOLN
Status: DC | PRN
  Administered 2015-07-20 (×4): 6000 mL

## 2015-07-20 MED ORDER — ONDANSETRON HCL 4 MG/2ML IJ SOLN
INTRAMUSCULAR | Status: AC
  Filled 2015-07-20: qty 2

## 2015-07-20 MED ORDER — LACTATED RINGERS IV SOLN
INTRAVENOUS | Status: DC
  Administered 2015-07-20: 50 mL/h via INTRAVENOUS
  Administered 2015-07-20 (×2): via INTRAVENOUS

## 2015-07-20 MED ORDER — BUPIVACAINE-EPINEPHRINE (PF) 0.5% -1:200000 IJ SOLN
INTRAMUSCULAR | Status: DC | PRN
  Administered 2015-07-20: 25 mL via PERINEURAL

## 2015-07-20 MED ORDER — NEOSTIGMINE METHYLSULFATE 10 MG/10ML IV SOLN
INTRAVENOUS | Status: DC | PRN
  Administered 2015-07-20: 4 mg via INTRAVENOUS

## 2015-07-20 MED ORDER — EPINEPHRINE HCL 1 MG/ML IJ SOLN
INTRAMUSCULAR | Status: AC
  Filled 2015-07-20: qty 1

## 2015-07-20 MED ORDER — KETOROLAC TROMETHAMINE 30 MG/ML IJ SOLN
30.0000 mg | Freq: Once | INTRAMUSCULAR | Status: AC | PRN
  Administered 2015-07-20: 30 mg via INTRAVENOUS

## 2015-07-20 MED ORDER — ROCURONIUM BROMIDE 100 MG/10ML IV SOLN
INTRAVENOUS | Status: DC | PRN
  Administered 2015-07-20: 50 mg via INTRAVENOUS
  Administered 2015-07-20: 10 mg via INTRAVENOUS

## 2015-07-20 MED ORDER — HYDROMORPHONE HCL 1 MG/ML IJ SOLN: 0.2500 mg | INTRAMUSCULAR | Status: DC | PRN

## 2015-07-20 MED ORDER — MIDAZOLAM HCL 2 MG/2ML IJ SOLN
INTRAMUSCULAR | Status: AC
  Filled 2015-07-20: qty 4

## 2015-07-20 MED ORDER — KETOROLAC TROMETHAMINE 30 MG/ML IJ SOLN
INTRAMUSCULAR | Status: AC
  Filled 2015-07-20: qty 1

## 2015-07-20 MED ORDER — CEFAZOLIN SODIUM-DEXTROSE 2-3 GM-% IV SOLR
INTRAVENOUS | Status: AC
  Administered 2015-07-20: 2 g via INTRAVENOUS
  Filled 2015-07-20: qty 50

## 2015-07-20 MED ORDER — LIDOCAINE HCL (CARDIAC) 20 MG/ML IV SOLN
INTRAVENOUS | Status: AC
  Filled 2015-07-20: qty 5

## 2015-07-20 MED ORDER — BUPIVACAINE-EPINEPHRINE (PF) 0.25% -1:200000 IJ SOLN
INTRAMUSCULAR | Status: AC
  Filled 2015-07-20: qty 30

## 2015-07-20 MED ORDER — FENTANYL CITRATE (PF) 250 MCG/5ML IJ SOLN
INTRAMUSCULAR | Status: AC
  Filled 2015-07-20: qty 5

## 2015-07-20 MED ORDER — PROMETHAZINE HCL 25 MG/ML IJ SOLN
6.2500 mg | INTRAMUSCULAR | Status: DC | PRN
  Administered 2015-07-20: 6.25 mg via INTRAVENOUS

## 2015-07-20 MED ORDER — OXYCODONE HCL 5 MG/5ML PO SOLN: 5.0000 mg | Freq: Once | ORAL | Status: DC | PRN

## 2015-07-20 MED ORDER — OXYCODONE HCL 5 MG PO TABS: 5.0000 mg | ORAL_TABLET | Freq: Once | ORAL | Status: DC | PRN

## 2015-07-20 MED ORDER — PROPOFOL 10 MG/ML IV BOLUS
INTRAVENOUS | Status: DC | PRN
  Administered 2015-07-20 (×2): 200 mg via INTRAVENOUS

## 2015-07-20 MED ORDER — GLYCOPYRROLATE 0.2 MG/ML IJ SOLN
INTRAMUSCULAR | Status: DC | PRN
  Administered 2015-07-20: 0.6 mg via INTRAVENOUS

## 2015-07-20 MED ORDER — ONDANSETRON HCL 4 MG/2ML IJ SOLN
INTRAMUSCULAR | Status: DC | PRN
  Administered 2015-07-20: 4 mg via INTRAVENOUS

## 2015-07-20 MED ORDER — MIDAZOLAM HCL 2 MG/2ML IJ SOLN
INTRAMUSCULAR | Status: AC
  Administered 2015-07-20: 2 mg
  Filled 2015-07-20: qty 2

## 2015-07-20 MED ORDER — ROCURONIUM BROMIDE 50 MG/5ML IV SOLN
INTRAVENOUS | Status: AC
  Filled 2015-07-20: qty 1

## 2015-07-20 SURGICAL SUPPLY — 71 items
ANCH SUT SHRT 12.5 CANN EYLT (Anchor) ×3 IMPLANT
ANCHOR SUT BIOCOMP LK 2.9X12.5 (Anchor) ×6 IMPLANT
BLADE CUDA 4.2 (BLADE) ×3 IMPLANT
BLADE SURG 11 STRL SS (BLADE) ×5 IMPLANT
BLADE SURG ROTATE 9660 (MISCELLANEOUS) IMPLANT
BUR VERTEX HOODED 4.5 (BURR) IMPLANT
CANNULA 5.75X7 CRYSTAL CLEAR (CANNULA) IMPLANT
CANNULA SHOULDER 7CM (CANNULA) ×6 IMPLANT
CANNULA TWIST IN 8.25X7CM (CANNULA) ×4 IMPLANT
CLOSURE WOUND 1/2 X4 (GAUZE/BANDAGES/DRESSINGS)
CONNECTOR 5 IN 1 STRAIGHT STRL (MISCELLANEOUS) ×2 IMPLANT
COVER SURGICAL LIGHT HANDLE (MISCELLANEOUS) ×3 IMPLANT
DECANTER SPIKE VIAL GLASS SM (MISCELLANEOUS) IMPLANT
DISTRACTOR SHOULDER 3 POINT (INSTRUMENTS) ×2 IMPLANT
DRAPE IMP U-DRAPE 54X76 (DRAPES) ×3 IMPLANT
DRAPE INCISE IOBAN 66X45 STRL (DRAPES) IMPLANT
DRAPE SHOULDER BEACH CHAIR (DRAPES) ×3 IMPLANT
DRAPE SURG 17X23 STRL (DRAPES) ×3 IMPLANT
DRAPE U-SHAPE 47X51 STRL (DRAPES) ×3 IMPLANT
DRSG PAD ABDOMINAL 8X10 ST (GAUZE/BANDAGES/DRESSINGS) ×4 IMPLANT
DURAPREP 26ML APPLICATOR (WOUND CARE) ×3 IMPLANT
ELECT REM PT RETURN 9FT ADLT (ELECTROSURGICAL)
ELECTRODE REM PT RTRN 9FT ADLT (ELECTROSURGICAL) IMPLANT
FLUID NSS /IRRIG 3000 ML XXX (IV SOLUTION) ×16 IMPLANT
GAUZE SPONGE 4X4 12PLY STRL (GAUZE/BANDAGES/DRESSINGS) ×3 IMPLANT
GAUZE XEROFORM 1X8 LF (GAUZE/BANDAGES/DRESSINGS) ×3 IMPLANT
GLOVE BIO SURGEON STRL SZ8 (GLOVE) ×3 IMPLANT
GLOVE BIOGEL PI IND STRL 6.5 (GLOVE) IMPLANT
GLOVE BIOGEL PI IND STRL 8 (GLOVE) ×1 IMPLANT
GLOVE BIOGEL PI INDICATOR 6.5 (GLOVE) ×2
GLOVE BIOGEL PI INDICATOR 8 (GLOVE) ×2
GLOVE ORTHO TXT STRL SZ7.5 (GLOVE) ×3 IMPLANT
GLOVE SURG SS PI 6.5 STRL IVOR (GLOVE) ×2 IMPLANT
GLOVE SURG SS PI 7.0 STRL IVOR (GLOVE) ×2 IMPLANT
GOWN STRL REUS W/ TWL LRG LVL3 (GOWN DISPOSABLE) ×2 IMPLANT
GOWN STRL REUS W/ TWL XL LVL3 (GOWN DISPOSABLE) ×4 IMPLANT
GOWN STRL REUS W/TWL LRG LVL3 (GOWN DISPOSABLE) ×3
GOWN STRL REUS W/TWL XL LVL3 (GOWN DISPOSABLE) ×9
KIT BASIN OR (CUSTOM PROCEDURE TRAY) ×3 IMPLANT
KIT PUSHLOCK 2.9 HIP (KITS) ×2 IMPLANT
KIT ROOM TURNOVER OR (KITS) ×3 IMPLANT
KIT SHOULDER TRACTION (DRAPES) ×3 IMPLANT
LASSO 90 CVE QUICKPAS (DISPOSABLE) ×2 IMPLANT
MANIFOLD NEPTUNE II (INSTRUMENTS) ×3 IMPLANT
NDL 1/2 CIR CATGUT .05X1.09 (NEEDLE) IMPLANT
NDL HYPO 25GX1X1/2 BEV (NEEDLE) IMPLANT
NDL SCORPION (NEEDLE) IMPLANT
NDL SPNL 18GX3.5 QUINCKE PK (NEEDLE) ×1 IMPLANT
NEEDLE 1/2 CIR CATGUT .05X1.09 (NEEDLE) IMPLANT
NEEDLE HYPO 25GX1X1/2 BEV (NEEDLE) IMPLANT
NEEDLE SCORPION (NEEDLE) IMPLANT
NEEDLE SPNL 18GX3.5 QUINCKE PK (NEEDLE) ×6 IMPLANT
NS IRRIG 1000ML POUR BTL (IV SOLUTION) ×3 IMPLANT
PACK SHOULDER (CUSTOM PROCEDURE TRAY) ×3 IMPLANT
PACK UNIVERSAL I (CUSTOM PROCEDURE TRAY) ×3 IMPLANT
PAD ARMBOARD 7.5X6 YLW CONV (MISCELLANEOUS) ×6 IMPLANT
SET ARTHROSCOPY TUBING (MISCELLANEOUS) ×3
SET ARTHROSCOPY TUBING LN (MISCELLANEOUS) ×1 IMPLANT
SLING ARM IMMOBILIZER XL (CAST SUPPLIES) ×2 IMPLANT
SPONGE GAUZE 4X4 12PLY STER LF (GAUZE/BANDAGES/DRESSINGS) ×2 IMPLANT
SPONGE LAP 4X18 X RAY DECT (DISPOSABLE) ×3 IMPLANT
STAPLER VISISTAT 35W (STAPLE) IMPLANT
STRIP CLOSURE SKIN 1/2X4 (GAUZE/BANDAGES/DRESSINGS) IMPLANT
SUT ETHILON 3 0 PS 1 (SUTURE) ×4 IMPLANT
SYR CONTROL 10ML LL (SYRINGE) IMPLANT
TAPE CLOTH SURG 6X10 WHT LF (GAUZE/BANDAGES/DRESSINGS) ×2 IMPLANT
TAPE LABRALWHITE 1.5X36 (TAPE) ×6 IMPLANT
TOWEL OR 17X24 6PK STRL BLUE (TOWEL DISPOSABLE) ×3 IMPLANT
TOWEL OR 17X26 10 PK STRL BLUE (TOWEL DISPOSABLE) ×3 IMPLANT
WAND HAND CNTRL MULTIVAC 90 (MISCELLANEOUS) ×3 IMPLANT
WATER STERILE IRR 1000ML POUR (IV SOLUTION) ×3 IMPLANT

## 2015-07-20 NOTE — Anesthesia Procedure Notes (Addendum)
Anesthesia Regional Block:  Interscalene brachial plexus block  Pre-Anesthetic Checklist: ,, timeout performed, Correct Patient, Correct Site, Correct Laterality, Correct Procedure, Correct Position, site marked, Risks and benefits discussed,  Surgical consent,  Pre-op evaluation,  At surgeon's request and post-op pain management   Prep: chloraprep       Needles:  Injection technique: Single-shot  Needle Type: Echogenic Stimulator Needle     Needle Length: 9cm 9 cm Needle Gauge: 21 and 21 G    Additional Needles:  Procedures: ultrasound guided (picture in chart) and nerve stimulator Interscalene brachial plexus block  Nerve Stimulator or Paresthesia:  Response: biceps, 0.5 mA,   Additional Responses:   Narrative:  Start time: 07/20/2015 10:30 AM End time: 07/20/2015 10:40 AM Injection made incrementally with aspirations every 5 mL.  Performed by: Personally  Anesthesiologist: Marcene Duos  Additional Notes: Risks and benefits discussed. Pt tolerated well with no immediate complications.   Procedure Name: Intubation Date/Time: 07/20/2015 11:50 AM Performed by: Fransisca Kaufmann Pre-anesthesia Checklist: Patient identified, Timeout performed, Emergency Drugs available, Suction available and Patient being monitored Patient Re-evaluated:Patient Re-evaluated prior to inductionOxygen Delivery Method: Circle system utilized Preoxygenation: Pre-oxygenation with 100% oxygen Intubation Type: IV induction Ventilation: Mask ventilation without difficulty Laryngoscope Size: Miller and 2 Grade View: Grade I Tube size: 7.5 mm Number of attempts: 1 Airway Equipment and Method: Stylet Placement Confirmation: ETT inserted through vocal cords under direct vision,  positive ETCO2 and breath sounds checked- equal and bilateral Secured at: 22 cm Tube secured with: Tape Dental Injury: Teeth and Oropharynx as per pre-operative assessment  Comments: IV induction Fitzgerald- intubation AM  CRNA- atraumatic- front teeth discolored with irregular surfaces- bilat BS Sampson Goon

## 2015-07-20 NOTE — Op Note (Signed)
NAMELARICO, DIMOCK NO.:  [id number]  MEDICAL RECORD NO.:  [id number]  LOCATION:  [facility]                         FACILITY:  [facility]  PHYSICIAN:  Vanita Panda. 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  PROCEDURE:  Left shoulder arthroscopy with repair of at least the anterior labrum with Arthrex suture anchors.  SURGEON:  Vanita Panda. Magnus Ivan, M.D.  ASSISTANT:  Richardean Canal, PA-C.  ANESTHESIA: 1. Left shoulder block. 2. General.  BLOOD LOSS:  Minimal.  COMPLICATIONS:  None.  INDICATIONS:  Mr. Bomar is a 24 year old gentleman who has had multiple recurrent dislocations from early age of his left shoulder.  He eventually sought treatment, and an MR arthrogram was obtained that showed surprisingly a circumferential labral tear posteriorly all the way to anterior-inferior.  We recommended attempt at repairing his labrum with the understanding that we are going to only repair __________ soft tissue would allow.  The risks and benefits of surgery were explained to him in detail and he did wish to proceed.  DESCRIPTION OF PROCEDURE:  Left shoulder was marked, and the shoulder block was obtained.  He was brought to the operating room, placed supine on the operating table.  General anesthesia was obtained.  He was then turned to the lateral decubitus position with a bean bag positioner with appropriate position of the head and neck and padding of all relevant areas including the genitals.  His left shoulder was prepped and draped with DuraPrep and sterile drapes and was placed in Arthrex shoulder immobilizer and traction  device using 10 pounds of traction.  Time-out was called and he was identified as correct patient, correct left shoulder.  I then made a posterior arthroscopy portal off the posterolateral edge of the acromion near the glenohumeral joint and found a circumferential labral tear of the shoulder.  Right away, we made two anterior portals and was able to at least place two suture anchors anteriorly to advance the anterior labrum repair.  There was a flap tear of the posterior labrum and we made several portals and changed the camera route from front to back looking and the tissue was too disrupted, even tried to put an anchor and we tried to do this, but it was just too disrupted posteriorly.  We then removed all instrumentation and closed all portal sites with interrupted nylon sutures.  The shoulder was placed in a well-padded sterile dressing and a sling.  He was awakened, extubated, and taken to recovery room in stable condition.  All final counts were correct with no complications noted. Postoperatively, we will need to go very slow on him and limit his activities of the shoulder for many months ahead to hopefully get the shoulder that does not come out of place and he understands this well.     Vanita Panda. Magnus Ivan, M.D.     CYB/MEDQ  D:  07/20/2015  T:  07/20/2015  Job:  [id number]

## 2015-07-20 NOTE — Discharge Instructions (Signed)
Ice for swelling throughout the next few days. Expect lots of bloody drainage. Absolutely no attempts to reach behind with your left arm or overhead. You must sleep in your sling. Wait 24-48 hours before getting your incisions wet. Daily band-aids over all incisions.

## 2015-07-20 NOTE — Brief Op Note (Signed)
07/20/2015  1:50 PM  PATIENT:  Michael Crane  24 y.o. male  PRE-OPERATIVE DIAGNOSIS:  Left shoulder labral tear  POST-OPERATIVE DIAGNOSIS:  Left shoulder labral tear  PROCEDURE:  Procedure(s): Left Shoulder Arthroscopy with Labral Repair (Left)  SURGEON:  Surgeon(s) and Role:    * Kathryne Hitch, MD - Primary  PHYSICIAN ASSISTANT: Rexene Edison, PA-C  ANESTHESIA:   regional and general  EBL:  Total I/O In: 1000 [I.V.:1000] Out: -   BLOOD ADMINISTERED:none  DRAINS: none   LOCAL MEDICATIONS USED:  NONE  SPECIMEN:  No Specimen  DISPOSITION OF SPECIMEN:  N/A  COUNTS:  YES  TOURNIQUET:  * No tourniquets in log *  DICTATION: .Other Dictation: Dictation Number [id number]  PLAN OF CARE: Discharge to home after PACU  PATIENT DISPOSITION:  PACU - hemodynamically stable.   Delay start of Pharmacological VTE agent (>24hrs) due to surgical blood loss or risk of bleeding: not applicable

## 2015-07-20 NOTE — Transfer of Care (Signed)
Immediate Anesthesia Transfer of Care Note  Patient: Michael Crane  Procedure(s) Performed: Procedure(s): Left Shoulder Arthroscopy with Labral Repair (Left)  Patient Location: PACU  Anesthesia Type:General and Regional  Level of Consciousness: awake, alert , oriented and sedated  Airway & Oxygen Therapy: Patient Spontanous Breathing and Patient connected to nasal cannula oxygen  Post-op Assessment: Report given to RN, Post -op Vital signs reviewed and stable and Patient moving all extremities  Post vital signs: Reviewed and stable  Last Vitals:  Filed Vitals:   07/20/15 1414  BP:   Pulse:   Temp:   Resp: 16    Complications: No apparent anesthesia complications

## 2015-07-20 NOTE — H&P (Signed)
Michael Crane is an 24 y.o. male.   Chief Complaint:   Recurrent dislocations of left shoulder; known extensive labral tear HPI:   24 yo male with a history of recurrent dislocations of his left shoulder.  A MR arthrogram confirms an extensive labral tear.  He now presents for an attempted shoulder arthroscopy with hopefully being able to repair and tighten up the labral in an effort to stabilize his shoulder.  He understands fully the risks of re-dislocation as well as infection and nerve/vessel injury.  History reviewed. No pertinent past medical history.  Past Surgical History  Procedure Laterality Date  . Appendectomy    . Shoulder surgery      pin in left shoulder    Family History  Problem Relation Age of Onset  . Diabetes Father   . Heart disease Father    Social History:  reports that he has never smoked. His smokeless tobacco use includes Chew. He reports that he drinks alcohol. He reports that he does not use illicit drugs.  Allergies: No Known Allergies  No prescriptions prior to admission    No results found for this or any previous visit (from the past 48 hour(s)). No results found.  Review of Systems  All other systems reviewed and are negative.   Blood pressure 142/62, pulse 73, temperature 97.6 F (36.4 C), temperature source Oral, resp. rate 20, SpO2 97 %. Physical Exam  Constitutional: He is oriented to person, place, and time. He appears well-developed and well-nourished.  HENT:  Head: Normocephalic and atraumatic.  Eyes: EOM are normal. Pupils are equal, round, and reactive to light.  Neck: Normal range of motion. Neck supple.  Cardiovascular: Normal rate and regular rhythm.   Respiratory: Effort normal and breath sounds normal.  GI: Soft. Bowel sounds are normal.  Musculoskeletal:       Left shoulder: He exhibits decreased range of motion, bony tenderness and decreased strength.  Neurological: He is alert and oriented to person, place, and time.   Skin: Skin is warm and dry.  Psychiatric: He has a normal mood and affect.    Positive aprehesion sign left shoulder with gross instability   Assessment/Plan Recurrent dislocations of left shoulder with extensive labral tear 1)  To the OR today for a left shoulder arthroscopy and attempted labral repair  BLACKMAN,CHRISTOPHER Y 07/20/2015, 10:12 AM

## 2015-07-20 NOTE — Progress Notes (Signed)
Report given to robin roberts rn as caregiver 

## 2015-07-21 ENCOUNTER — Encounter (HOSPITAL_COMMUNITY): Payer: Self-pay | Admitting: Orthopaedic Surgery

## 2015-07-23 NOTE — Anesthesia Postprocedure Evaluation (Signed)
  Anesthesia Post-op Note  Patient: Michael Crane  Procedure(s) Performed: Procedure(s): Left Shoulder Arthroscopy with Labral Repair (Left)  Patient Location: PACU  Anesthesia Type:GA combined with regional for post-op pain  Level of Consciousness: awake, alert  and oriented  Airway and Oxygen Therapy: Patient Spontanous Breathing  Post-op Pain: none  Post-op Assessment: Post-op Vital signs reviewed              Post-op Vital Signs: Reviewed  Last Vitals:  Filed Vitals:   07/20/15 1650  BP: 128/70  Pulse: 111  Temp: 36.7 C  Resp: 21    Complications: No apparent anesthesia complications

## 2016-04-29 ENCOUNTER — Ambulatory Visit (HOSPITAL_COMMUNITY)
Admission: EM | Admit: 2016-04-29 | Discharge: 2016-04-29 | Disposition: A | Discharge status: Home / Self Care | Payer: 59 | Attending: Family Medicine | Admitting: Family Medicine

## 2016-04-29 ENCOUNTER — Encounter (HOSPITAL_COMMUNITY): Payer: Self-pay | Admitting: Emergency Medicine

## 2016-04-29 DIAGNOSIS — L237 Allergic contact dermatitis due to plants, except food: Secondary | ICD-10-CM

## 2016-04-29 MED ORDER — METHYLPREDNISOLONE SODIUM SUCC 125 MG IJ SOLR
125.0000 mg | Freq: Once | INTRAMUSCULAR | Status: AC
  Administered 2016-04-29: 125 mg via INTRAMUSCULAR

## 2016-04-29 MED ORDER — PREDNISONE 10 MG PO TABS: 20.0000 mg | ORAL_TABLET | Freq: Every day | ORAL | Status: AC

## 2016-04-29 MED ORDER — METHYLPREDNISOLONE SODIUM SUCC 125 MG IJ SOLR
INTRAMUSCULAR | Status: AC
  Filled 2016-04-29: qty 2
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
## 2016-04-30 MED FILL — predniSONE 10 MG TABS: 10 | 8 days supply | Qty: 15 | Fill #0
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
## 2016-07-18 ENCOUNTER — Encounter: Payer: Self-pay | Admitting: Internal Medicine

## 2017-09-01 ENCOUNTER — Emergency Department (HOSPITAL_COMMUNITY): Payer: Self-pay

## 2017-09-01 ENCOUNTER — Encounter (HOSPITAL_COMMUNITY): Payer: Self-pay | Admitting: *Deleted

## 2017-09-01 ENCOUNTER — Ambulatory Visit (INDEPENDENT_AMBULATORY_CARE_PROVIDER_SITE_OTHER): Payer: Self-pay | Admitting: Physician Assistant

## 2017-09-01 ENCOUNTER — Emergency Department (HOSPITAL_COMMUNITY)
Admission: EM | Admit: 2017-09-01 | Discharge: 2017-09-01 | Disposition: A | Discharge status: Home / Self Care | Payer: Self-pay | Attending: Emergency Medicine | Admitting: Emergency Medicine

## 2017-09-01 DIAGNOSIS — M25561 Pain in right knee: Secondary | ICD-10-CM | POA: Insufficient documentation

## 2017-09-01 MED ORDER — NAPROXEN 500 MG PO TABS: 500.0000 mg | ORAL_TABLET | Freq: Two times a day (BID) | ORAL | 0 refills | Status: AC

## 2017-09-01 MED ORDER — METHYLPREDNISOLONE ACETATE 40 MG/ML IJ SUSP
40.0000 mg | INTRAMUSCULAR | Status: AC | PRN
  Administered 2017-09-01: 40 mg via INTRA_ARTICULAR

## 2017-09-01 MED ORDER — BUPIVACAINE HCL 0.25 % IJ SOLN
4.0000 mL | INTRAMUSCULAR | Status: AC | PRN
  Administered 2017-09-01: 4 mL via INTRA_ARTICULAR

## 2017-09-01 MED ORDER — TRAMADOL HCL 50 MG PO TABS: 50.0000 mg | ORAL_TABLET | Freq: Four times a day (QID) | ORAL | 0 refills | Status: AC | PRN

## 2017-09-01 NOTE — ED Provider Notes (Signed)
[facility]-EMERGENCY DEPT Provider Note   CSN: [id number] Arrival date & time: 09/01/17  7829     History   Chief Complaint Chief Complaint  Patient presents with  . Knee Pain    HPI Michael Crane is a 26 y.o. male.  HPI  Patient presents to ED for evaluation of right knee pain since yesterday. He states that he was cutting a tree outside when all of a sudden his knee twisted. He has been ambulatory with pain since incident. He has been using crutches borrowed from family member. He has not tried any medications to help with pain but has used ice with some improvement in symptoms. He denies any previous fracture, dislocation or procedure in the area. He states prior to the incident and he felt fine and was able to walk normally. He denies any fever, chills, redness of joint, numbness, headaches or loss of consciousness.  History reviewed. No pertinent past medical history.  Patient Active Problem List   Diagnosis Date Noted  . 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 Labile hypertension     Past Surgical History:  Procedure Laterality Date  . APPENDECTOMY    . SHOULDER ARTHROSCOPY Left 07/20/2015   Procedure: Left Shoulder Arthroscopy with Labral Repair;  Surgeon: Kathryne Hitch, MD;  Location: [facility] OR;  Service: Orthopedics;  Laterality: Left;  . SHOULDER SURGERY     pin in left shoulder       Home Medications    Prior to Admission medications   Medication Sig Start Date End Date Taking? Authorizing Provider  naproxen (NAPROSYN) 500 MG tablet Take 1 tablet (500 mg total) by mouth 2 (two) times daily. 09/01/17   Damonie Furney, PA-C  oxyCODONE-acetaminophen (ROXICET) 5-325 MG per tablet Take 1-2 tablets by mouth every 4 (four) hours as needed. Patient not taking: Reported on 04/29/2016 07/20/15   Kathryne Hitch, MD  predniSONE (DELTASONE) 10 MG tablet Take 2 tablets (20 mg total) by mouth daily. 04/29/16   Tharon Aquas, PA     Family History Family History  Problem Relation Age of Onset  . Diabetes Father   . Heart disease Father     Social History Social History  Substance Use Topics  . Smoking status: Never Smoker  . Smokeless tobacco: Current User    Types: Chew  . Alcohol use Yes     Comment: ocassional     Allergies   Patient has no known allergies.   Review of Systems Review of Systems  Constitutional: Negative for chills and fever.  Musculoskeletal: Positive for arthralgias, gait problem and myalgias. Negative for joint swelling.  Skin: Negative for color change, rash and wound.  Neurological: Negative for weakness and numbness.     Physical Exam Updated Vital Signs BP (!) 148/88 (BP Location: Right Arm)   Pulse 90   Temp 98.6 F (37 C) (Oral)   Resp 20   SpO2 100%   Physical Exam  Constitutional: He appears well-developed and well-nourished. No distress.  HENT:  Head: Normocephalic and atraumatic.  Eyes: Conjunctivae and EOM are normal. No scleral icterus.  Neck: Normal range of motion.  Pulmonary/Chest: Effort normal. No respiratory distress.  Musculoskeletal: Normal range of motion. He exhibits edema and tenderness. He exhibits no deformity.  Mild edema noted of the medial aspect of the right knee with tenderness to palpation in the area. No color or temperature change noted. Full active and passive range of motion with pain of the  knee. Sensation intact to light touch.  Neurological: He is alert.  Skin: No rash noted. He is not diaphoretic.  Psychiatric: He has a normal mood and affect.  Nursing note and vitals reviewed.    ED Treatments / Results  Labs (all labs ordered are listed, but only abnormal results are displayed) Labs Reviewed - No data to display  EKG  EKG Interpretation None       Radiology Dg Knee 2 Views Right  Result Date: 09/01/2017 CLINICAL DATA:  26 year old male status post blunt trauma, struck by a tree branch. Medial pain. EXAM:  RIGHT KNEE - 1-2 VIEW COMPARISON:  None. FINDINGS: Bone mineralization is within normal limits. Normal joint spaces and alignment. No joint effusion identified. Patella intact. No fracture identified. IMPRESSION: Negative. No acute fracture or dislocation identified about the right knee. Electronically Signed   By: Odessa Fleming M.D.   On: 09/01/2017 09:02    Procedures Procedures (including critical care time)  Medications Ordered in ED Medications - No data to display   Initial Impression / Assessment and Plan / ED Course  I have reviewed the triage vital signs and the nursing notes.  Pertinent labs & imaging results that were available during my care of the patient were reviewed by me and considered in my medical decision making (see chart for details).     Patient presents to ED for evaluation of right knee pain since yesterday. Did notice a twisting motion before the pain began. No previous fracture, dislocation or procedure in the area. He has been ambulatory with pain. On physical exam there is mild edema noted in the medial aspect of the patella but no visible color temperature change noted. He is able to flex and extend knee although there is pain. There is low suspicion for septic joint as the cause of his symptoms based on physical exam and history. X-rays were negative for acute abnormality. Encouraged patient to wear a knee sleeve and use anti-inflammatories as directed. We'll have him follow up with orthopedics and use crutches but bear weight as tolerated and extremity. Patient appears stable for discharge at this time. Strict return precautions given.  Final Clinical Impressions(s) / ED Diagnoses   Final diagnoses:  Acute pain of right knee    New Prescriptions New Prescriptions   NAPROXEN (NAPROSYN) 500 MG TABLET    Take 1 tablet (500 mg total) by mouth 2 (two) times daily.     Dietrich Pates, PA-C 09/01/17 1610    Linwood Dibbles, MD 09/05/17 [phone]

## 2017-09-01 NOTE — Progress Notes (Signed)
Office Visit Note   Patient: Michael Crane           Date of Birth: [birth year]           MRN: [id number] Visit Date: 09/01/2017              Requested by: No referring provider defined for this encounter. PCP: Patient, No Pcp Per   Assessment & Plan: Visit Diagnoses: No diagnosis found.  Plan: We'll place him out of work for the next 2 weeks. He is to be mindful of any mechanical symptoms of the knee which are reviewed with him. If he develops any mechanical symptoms or call our office if that we can obtain an MRI of his knee. He'll follow up with [place] in 2 weeks to check his progress lack of. Weightbearing as tolerated on the right knee.  Follow-Up Instructions: Return in about 2 weeks (around 09/15/2017).   Orders:  No orders of the defined types were placed in this encounter.  Meds ordered this encounter  Medications  . traMADol (ULTRAM) 50 MG tablet    Sig: Take 1 tablet (50 mg total) by mouth every 6 (six) hours as needed for moderate pain.    Dispense:  40 tablet    Refill:  0      Procedures: Large Joint Inj Date/Time: 09/01/2017 3:28 PM Performed by: Kirtland Bouchard Authorized by: Kirtland Bouchard   Consent Given by:  Patient Indications:  Pain Location:  Knee Site:  R knee Needle Size:  22 G Approach:  Anterolateral Ultrasound Guidance: No   Fluoroscopic Guidance: No   Medications:  40 mg methylPREDNISolone acetate 40 MG/ML; 4 mL bupivacaine 0.25 % Aspiration Attempted: No   Patient tolerance:  Patient tolerated the procedure well with no immediate complications     Clinical Data: No additional findings.   Subjective: Right knee pain  HPI Mr. 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 He states he was at work yesterday cutting limb. He was suspended from [employer] as he was cutting the limb when the limb was cut through pushed him into the remaining tree limb and twisted his  knee. Radiographs taken in the ER earlier today are reviewed and showed no acute fractures no bony abnormalities. Review of Systems No loss consciousness chest pain shortness breath fevers chills  Objective: Vital Signs: There were no vitals taken for this visit.  Physical Exam  Constitutional: He is oriented to person, place, and time. He appears well-developed and well-nourished. No distress.  Pulmonary/Chest: Effort normal.  Neurological: He is alert and oriented to person, place, and time.  Skin: Skin is warm and dry. He is not diaphoretic.    Ortho Exam Bilateral knees he has full extension is able do straight leg raise. Right knee no instability valgus varus stressing however with valgus stressing of the right knee has pain medial aspect. Has tenderness along medial joint line. Small effusion right knee. Anterior drawer grossly negative. Unable to perform McMurray's. No rashes skin lesions ulcerations or erythema of the right knee compared to left. Specialty Comments:  No specialty comments available.  Imaging: Dg Knee 2 Views Right  Result Date: 09/01/2017 CLINICAL DATA:  26 year old male status post blunt trauma, struck by a tree branch. Medial pain. EXAM: RIGHT KNEE - 1-2 VIEW COMPARISON:  None. FINDINGS: Bone mineralization is within normal limits. Normal joint spaces and alignment. No joint effusion identified. Patella intact. No  fracture identified. IMPRESSION: Negative. No acute fracture or dislocation identified about the right knee. Electronically Signed   By: Odessa Fleming M.D.   On: 09/01/2017 09:02     PMFS History: Patient Active Problem List   Diagnosis Date Noted  . 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 Labile hypertension    No past medical history on file.  Family History  Problem Relation Age of Onset  . Diabetes Father   . Heart disease Father     Past Surgical History:  Procedure Laterality Date  . APPENDECTOMY    .  SHOULDER ARTHROSCOPY Left 07/20/2015   Procedure: Left Shoulder Arthroscopy with Labral Repair;  Surgeon: Kathryne Hitch, MD;  Location: [facility] OR;  Service: Orthopedics;  Laterality: Left;  . SHOULDER SURGERY     pin in left shoulder   Social History   Occupational History  . Not on file.   Social History Main Topics  . Smoking status: Never Smoker  . Smokeless tobacco: Current User    Types: Chew  . Alcohol use Yes     Comment: ocassional  . Drug use: No  . Sexual activity: Not on file

## 2017-09-01 NOTE — ED Notes (Signed)
Pt wants copies of xrays , taken to xray for this

## 2017-09-01 NOTE — ED Triage Notes (Signed)
To ED for eval of right knee pain after injury while cutting tree yesterday. States he heard a pop. Ambulatory but with pain. Using crutches from home.

## 2017-09-01 NOTE — Discharge Instructions (Signed)
Please read attached information regarding your condition and rice therapy. Take naproxen scheduled for the next 2-3 days. Wear knee sleeve as directed. Use crutches but bear weight as tolerated on extremity. Follow-up with orthopedist listed below for further evaluation. Return to ED for worsening pain, increased swelling, falls, additional injury.

## 2017-09-15 ENCOUNTER — Ambulatory Visit (INDEPENDENT_AMBULATORY_CARE_PROVIDER_SITE_OTHER): Payer: Self-pay | Admitting: Physician Assistant

## 2017-09-15 VITALS — Ht 75.0 in | Wt 210.0 lb

## 2017-09-15 DIAGNOSIS — M25561 Pain in right knee: Secondary | ICD-10-CM | POA: Insufficient documentation

## 2017-09-15 NOTE — Progress Notes (Signed)
   Office Visit Note   Patient: Michael Crane           Date of Birth: October 11, 1991           MRN: [id number] Visit Date: 09/15/2017              Requested by: No referring provider defined for this encounter. PCP: Patient, No Pcp Per   Assessment & Plan: Visit Diagnoses:  1. Acute pain of right knee     Plan: We will obtain an MRI of his right knee to rule out internal derangement due to his mechanical symptoms and continued pain in the knee despite conservative management. He'll follow up with [place] after the MRI to go over results and discuss further treatment. Continue light duties at work.  Follow-Up Instructions: Return for AFTER MRI.   Orders:  No orders of the defined types were placed in this encounter.  No orders of the defined types were placed in this encounter.     Procedures: No procedures performed   Clinical Data: No additional findings.   Subjective: Chief Complaint  Patient presents with  . Right Knee - Pain    HPI [name] returns today follow-up of his right knee status post injection on 09/01/2017. States the knee feels better than it did however he is having locking giving way sensation he also feels the knee hyperextends. He has gone back to work light duties. Review of Systems See history of present illness otherwise negative  Objective: Vital Signs: Ht  (1.905 m)   Wt 210 lb (95.3 kg)   BMI 26.25 kg/m   Physical Exam  Constitutional: He is oriented to person, place, and time. He appears well-developed and well-nourished. No distress.  Neurological: He is alert and oriented to person, place, and time.  Skin: Skin is warm and dry. He is not diaphoretic.  Psychiatric: He has a normal mood and affect.    Ortho Exam Right knee no effusion abnormal warmth. He has tenderness along medial joint line. Positive McMurray's. Anterior drawer posterior drawer negative. Valgus varus stressing causes pain medial aspect knee. Otherwise good range  of motion of the knee. Specialty Comments:  No specialty comments available.  Imaging: No results found.   PMFS History: Patient Active Problem List   Diagnosis Date Noted  . Acute pain of right knee 09/15/2017  . 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 Labile hypertension    No past medical history on file.  Family History  Problem Relation Age of Onset  . Diabetes Father   . Heart disease Father     Past Surgical History:  Procedure Laterality Date  . APPENDECTOMY    . SHOULDER ARTHROSCOPY Left 07/20/2015   Procedure: Left Shoulder Arthroscopy with Labral Repair;  Surgeon: Kathryne Hitch, MD;  Location: [facility] OR;  Service: Orthopedics;  Laterality: Left;  . SHOULDER SURGERY     pin in left shoulder   Social History   Occupational History  . Not on file.   Social History Main Topics  . Smoking status: Never Smoker  . Smokeless tobacco: Current User    Types: Chew  . Alcohol use Yes     Comment: ocassional  . Drug use: No  . Sexual activity: Not on file

## 2017-09-15 NOTE — Addendum Note (Signed)
Addended by: Rogers Seeds on: 09/15/2017 10:56 AM   Modules accepted: Orders

## 2017-10-20 ENCOUNTER — Other Ambulatory Visit: Payer: Self-pay

## 2018-04-17 ENCOUNTER — Encounter (HOSPITAL_COMMUNITY): Payer: Self-pay | Admitting: Emergency Medicine

## 2018-04-17 ENCOUNTER — Emergency Department (HOSPITAL_COMMUNITY)
Admission: EM | Admit: 2018-04-17 | Discharge: 2018-04-17 | Disposition: A | Discharge status: Home / Self Care | Payer: Self-pay | Attending: Emergency Medicine | Admitting: Emergency Medicine

## 2018-04-17 ENCOUNTER — Emergency Department (HOSPITAL_COMMUNITY): Payer: Self-pay

## 2018-04-17 DIAGNOSIS — Z79899 Other long term (current) drug therapy: Secondary | ICD-10-CM | POA: Insufficient documentation

## 2018-04-17 DIAGNOSIS — M436 Torticollis: Secondary | ICD-10-CM | POA: Insufficient documentation

## 2018-04-17 MED ORDER — METHYLPREDNISOLONE 4 MG PO TBPK: ORAL_TABLET | ORAL | 0 refills | Status: AC

## 2018-04-17 MED ORDER — CYCLOBENZAPRINE HCL 10 MG PO TABS
10.0000 mg | ORAL_TABLET | Freq: Once | ORAL | Status: AC
  Administered 2018-04-17: 10 mg via ORAL
  Filled 2018-04-17: qty 1

## 2018-04-17 MED ORDER — MELOXICAM 15 MG PO TABS: 15.0000 mg | ORAL_TABLET | Freq: Every day | ORAL | 0 refills | Status: AC

## 2018-04-17 MED ORDER — KETOROLAC TROMETHAMINE 60 MG/2ML IM SOLN
30.0000 mg | Freq: Once | INTRAMUSCULAR | Status: AC
  Administered 2018-04-17: 30 mg via INTRAMUSCULAR
  Filled 2018-04-17: qty 2

## 2018-04-17 NOTE — ED Notes (Signed)
To x-ray

## 2018-04-17 NOTE — Discharge Instructions (Addendum)
[facility] [address] [place], [place] [address]  [phone] [facility] will book your appointment for you.  Hours: 7:30 am to 7pm Monday, Tuesday,Wednesday and Thursday

## 2018-04-17 NOTE — ED Provider Notes (Signed)
Patient placed in Quick Look pathway, seen and evaluated   Chief Complaint: neck pain  HPI:   Michael Crane is a 27 y.o. male who presents to the ED with neck pain that started 3 days ago after working out at [employer] Patient reports he was lifting and felt the pain. He went to Urgent Care and was evaluated and treated for muscle spasm. Patient has not gotten Rx filled. He is here due to increased pain and now c/o pain to the cervical spine. Patient having trouble sleeping due to pain.   ROS: Neck: pain  Physical Exam:  BP 129/83 (BP Location: Right Arm)   Pulse 75   Temp 98.3 F (36.8 C) (Oral)   Resp 16   SpO2 100%    Gen: No distress  Neuro: Awake and Alert, grips are equal  Skin: Warm and dry  Neck: tender with palpation of the cervical spine and right side of neck. Muscle spasm noted. Pain with range of motion.     Initiation of care has begun. The patient has been counseled on the process, plan, and necessity for staying for the completion/evaluation, and the remainder of the medical screening examination    Janne Napoleon, NP 04/17/18 1738    Gerhard Munch, MD 04/18/18 [phone]

## 2018-04-17 NOTE — ED Provider Notes (Signed)
[facility] EMERGENCY DEPARTMENT Provider Note   CSN: [id number] Arrival date & time: 04/17/18  1707     History   Chief Complaint Chief Complaint  Patient presents with  . Torticollis    HPI Michael Crane is a 27 y.o. male who presents for neck pain.  Patient states that he is doing bent over rows with a very heavy weight 3 days ago.  Patient states that he felt a small twinge of pain however it was not that bad.  By the evening he had more significant pain.  The pain is worsened with movement of the neck but felt close to the C7 process on the right side.  Patient states that it hurts worse to rotate his neck towards the right side.  He also has significant pain with forward flexion.  He denies any weakness, numbness or tingling in the upper extremities.  Patient was seen earlier at an urgent care and given Flexeril however did not fill the prescription.  HPI  History reviewed. No pertinent past medical history.  Patient Active Problem List   Diagnosis Date Noted  . Acute pain of right knee 09/15/2017  . 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 Labile hypertension     Past Surgical History:  Procedure Laterality Date  . APPENDECTOMY    . SHOULDER ARTHROSCOPY Left 07/20/2015   Procedure: Left Shoulder Arthroscopy with Labral Repair;  Surgeon: Kathryne Hitch, MD;  Location: [facility] OR;  Service: Orthopedics;  Laterality: Left;  . SHOULDER SURGERY     pin in left shoulder        Home Medications    Prior to Admission medications   Medication Sig Start Date End Date Taking? Authorizing Provider  naproxen (NAPROSYN) 500 MG tablet Take 1 tablet (500 mg total) by mouth 2 (two) times daily. Patient not taking: Reported on 09/15/2017 09/01/17   Dietrich Pates, PA-C  oxyCODONE-acetaminophen (ROXICET) 5-325 MG per tablet Take 1-2 tablets by mouth every 4 (four) hours as needed. Patient not taking: Reported on 04/29/2016 07/20/15    Kathryne Hitch, MD  predniSONE (DELTASONE) 10 MG tablet Take 2 tablets (20 mg total) by mouth daily. Patient not taking: Reported on 09/15/2017 04/29/16   Tharon Aquas, PA  traMADol (ULTRAM) 50 MG tablet Take 1 tablet (50 mg total) by mouth every 6 (six) hours as needed for moderate pain. 09/01/17   Kirtland Bouchard, PA-C    Family History Family History  Problem Relation Age of Onset  . Diabetes Father   . Heart disease Father     Social History Social History   Tobacco Use  . Smoking status: Never Smoker  . Smokeless tobacco: Current User    Types: Chew  Substance Use Topics  . Alcohol use: Yes    Comment: ocassional  . Drug use: No     Allergies   Patient has no known allergies.   Review of Systems Review of Systems  Musculoskeletal: Positive for neck pain and neck stiffness.  Neurological: Negative for weakness, light-headedness and numbness.     Physical Exam Updated Vital Signs BP 129/83 (BP Location: Right Arm)   Pulse 75   Temp 98.3 F (36.8 C) (Oral)   Resp 16   SpO2 100%   Physical Exam  Constitutional: He appears well-developed and well-nourished. No distress.  HENT:  Head: Normocephalic and atraumatic.  Eyes: Conjunctivae are normal. No scleral icterus.  Neck: Normal range of motion. Neck  supple.  Cardiovascular: Normal rate, regular rhythm and normal heart sounds.  Pulmonary/Chest: Effort normal and breath sounds normal. No respiratory distress.  Abdominal: Soft. There is no tenderness.  Musculoskeletal: He exhibits no edema.  Range of motion limited due to pain.  Pain reported by the C7 process on the right.  No midline spinal tenderness, pain is worse with forward flexion and right lateral rotation.  Normal upper extremity strength.  Equal bilateral grips, NVI in both upper extremities  Neurological: He is alert.  Skin: Skin is warm and dry. He is not diaphoretic.  Psychiatric: His behavior is normal.  Nursing note and vitals  reviewed.    ED Treatments / Results  Labs (all labs ordered are listed, but only abnormal results are displayed) Labs Reviewed - No data to display  EKG None  Radiology Dg Cervical Spine Complete  Result Date: 04/17/2018 CLINICAL DATA:  Pain in the right side of neck EXAM: CERVICAL SPINE - COMPLETE 4+ VIEW COMPARISON:  None. FINDINGS: Straightening of the cervical spine. Inadequate visualization of C7 and below. Visualized vertebral body heights are within normal limits. Dens and lateral masses are largely obscured by overlying teeth. Normal prevertebral soft tissue thickness. Screw fixation of the left clavicle. IMPRESSION: 1. Inadequate visualization of C7 and below. Dens and lateral masses are poorly visualized due to overlying teeth 2. No specific abnormality seen radiographically within the imaged portions of the cervical spine Electronically Signed   By: Jasmine Pang M.D.   On: 04/17/2018 18:34    Procedures Procedures (including critical care time)  Medications Ordered in ED Medications  cyclobenzaprine (FLEXERIL) tablet 10 mg (10 mg Oral Given 04/17/18 1848)  ketorolac (TORADOL) injection 30 mg (30 mg Intramuscular Given 04/17/18 1848)     Initial Impression / Assessment and Plan / ED Course  I have reviewed the triage vital signs and the nursing notes.  Pertinent labs & imaging results that were available during my care of the patient were reviewed by me and considered in my medical decision making (see chart for details).    Patient appears to have acute strain injury and torticollis.  No midline spinal tenderness.  Flexion-extension films are negative.  Patient will be started on anti-inflammatories, Medrol Dosepak and may continue with Flexeril.  Advised the patient on follow-up outpatient, return precautions and self home care.  I have no concern for unstable ligamentous injury of the neck.  Patient appears appropriate for discharge at this time  Final Clinical  Impressions(s) / ED Diagnoses   Final diagnoses:  Torticollis, acute    ED Discharge Orders    None       Arthor Captain, PA-C 04/17/18 Tawana Scale, MD 04/18/18 1757

## 2018-04-17 NOTE — ED Triage Notes (Signed)
Pt states he was working out when something in the right side of his neck popped, and it had become tighter the past few days. Pain with movement.

## 2018-10-23 IMAGING — DX DG CERVICAL SPINE COMPLETE 4+V
6 series · 6 of 6 positions shown · non-contrast
Comparison: None.

CLINICAL DATA: Pain in the right side of neck

EXAM:
CERVICAL SPINE - COMPLETE 4+ VIEW

[c-spine lat]
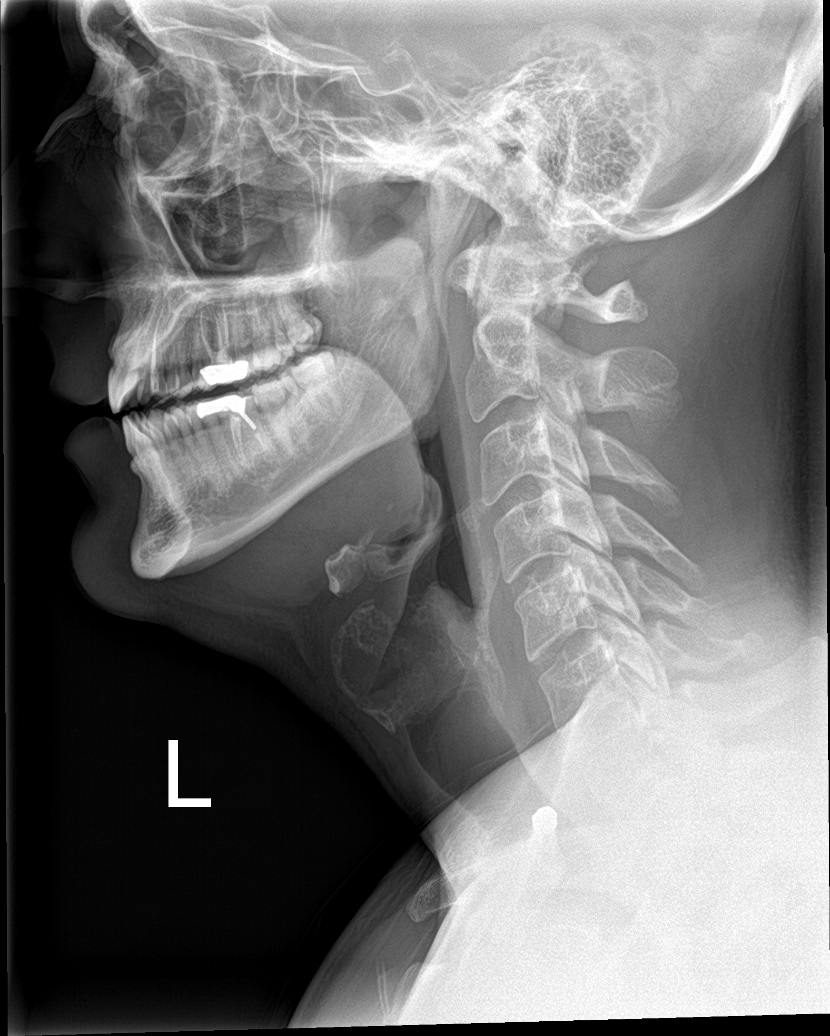

[c-spine obl (1 of 2)]
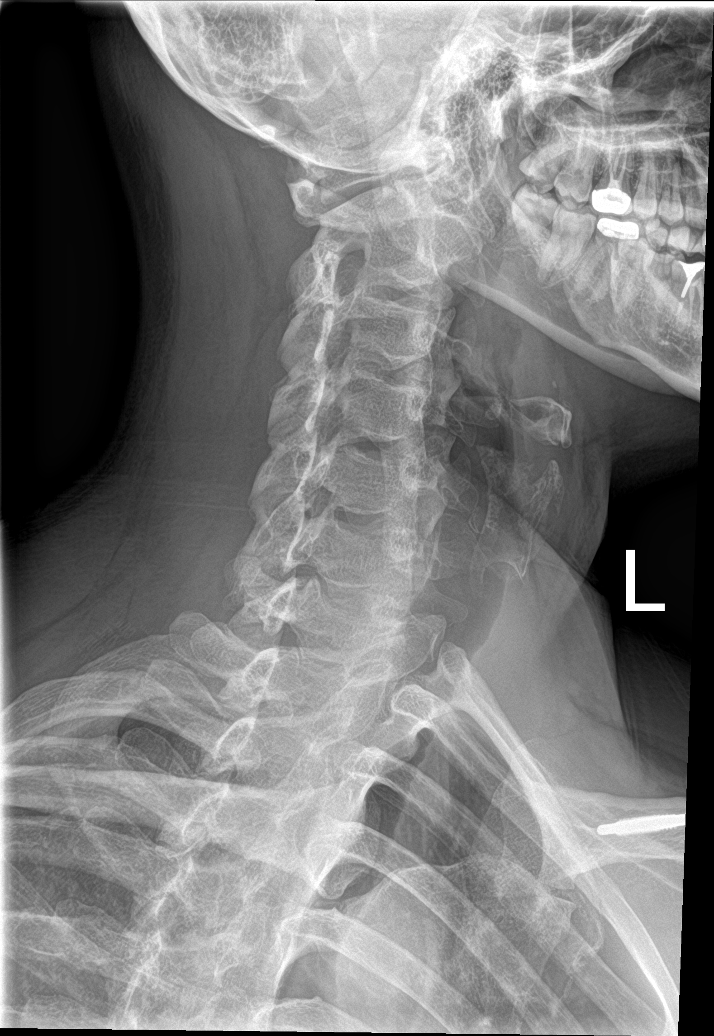

[c-spine obl (2 of 2)]
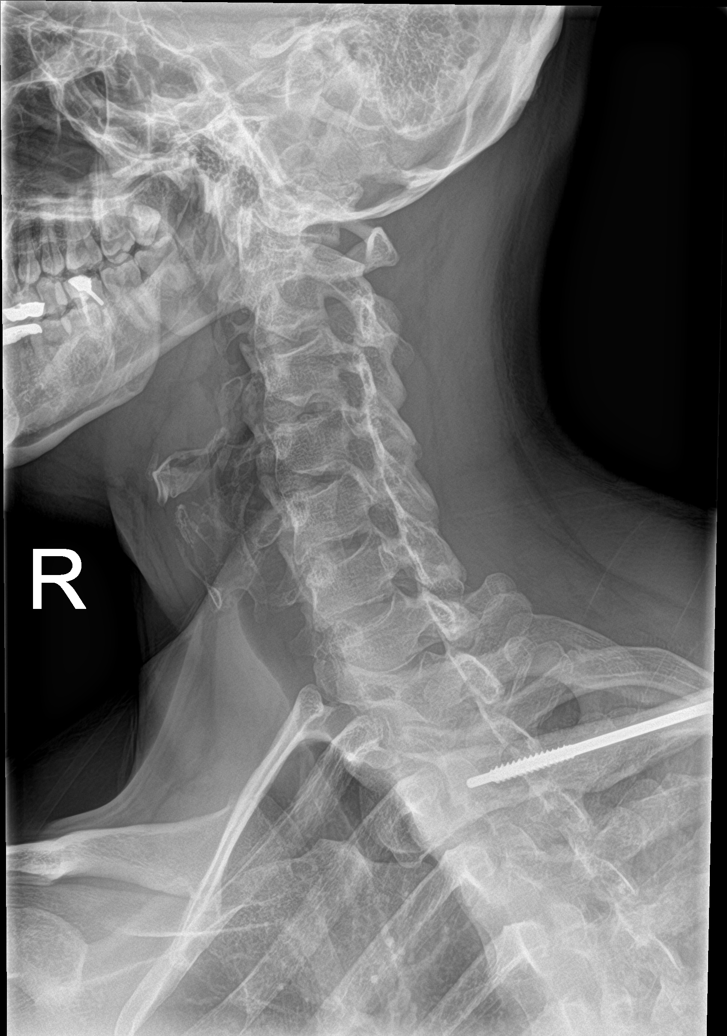

[c-spine ap]
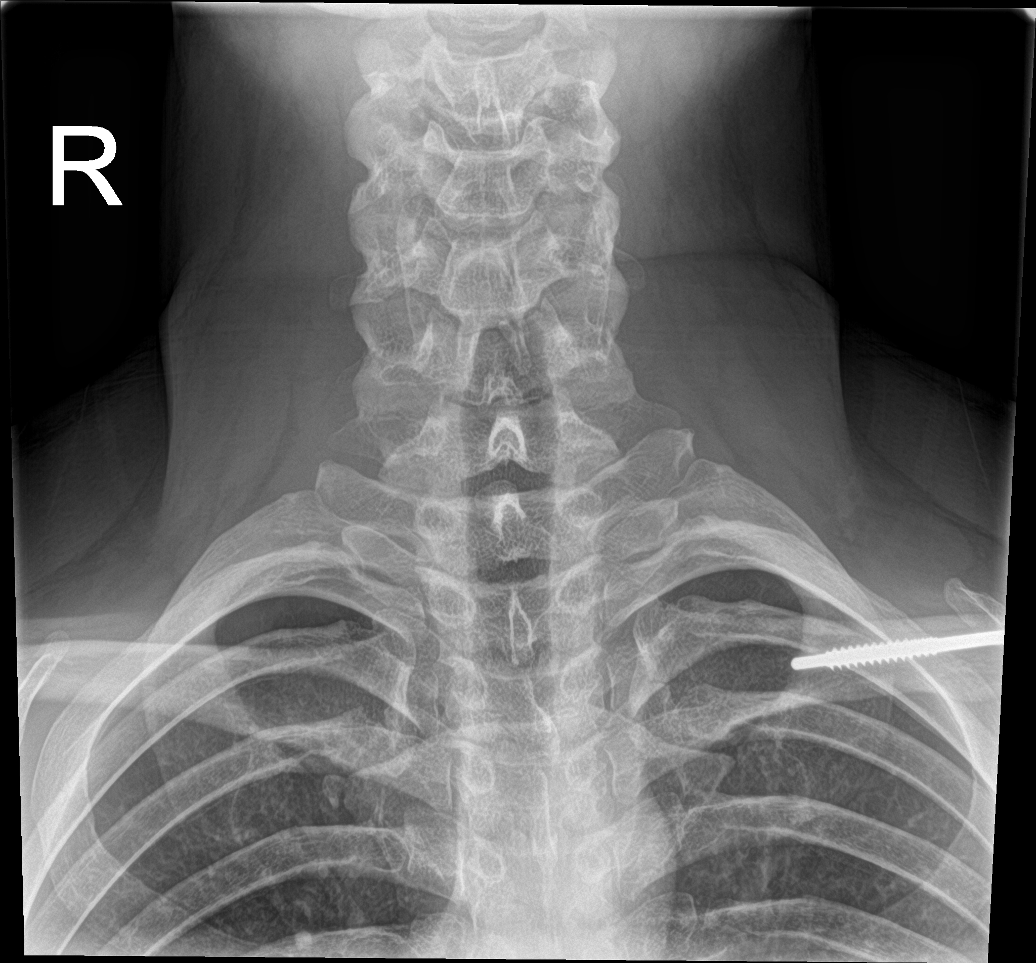

[c-spine open mouth (1 of 2)]
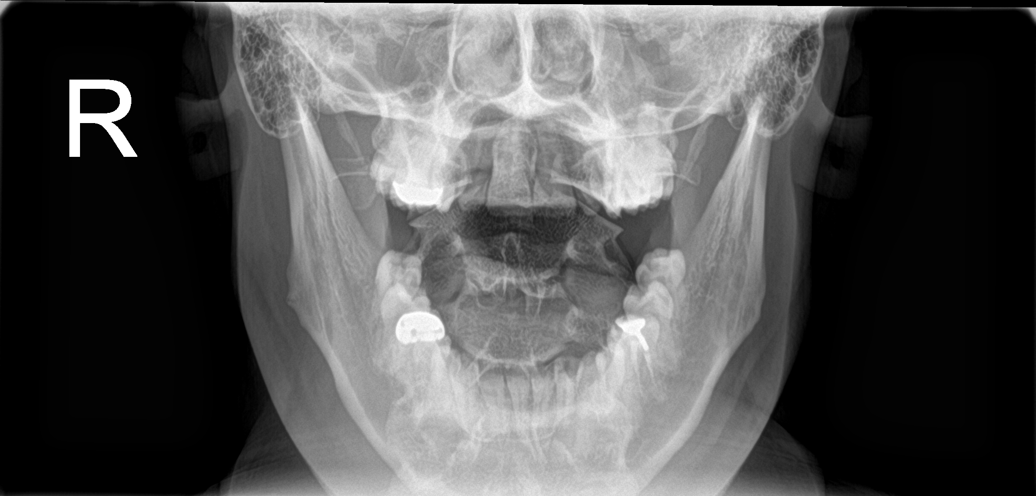

[c-spine open mouth (2 of 2)]
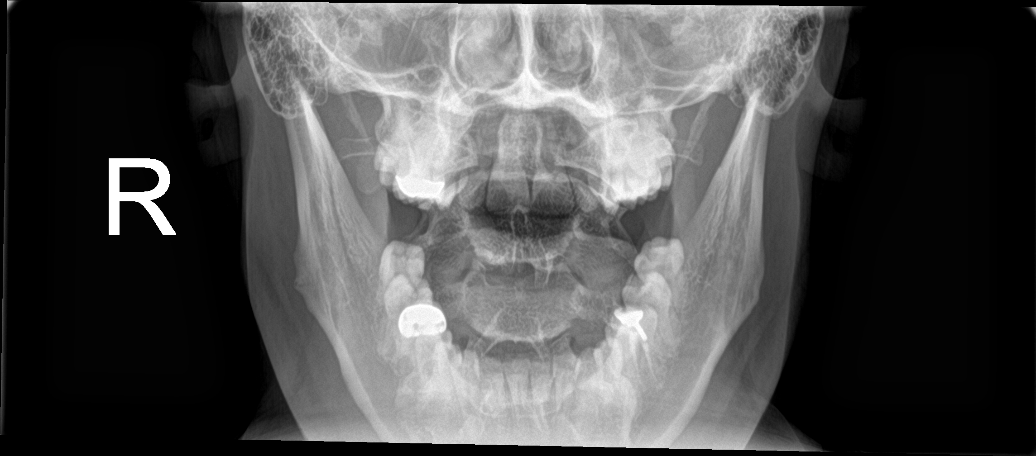

[6 of 6 positions shown; findings below may reference images not displayed]

FINDINGS: Straightening of the cervical spine. Inadequate visualization of C7
and below. Visualized vertebral body heights are within normal
limits. Dens and lateral masses are largely obscured by overlying
teeth. Normal prevertebral soft tissue thickness. Screw fixation of
the left clavicle.
IMPRESSION: 1. Inadequate visualization of C7 and below. Dens and lateral masses
are poorly visualized due to overlying teeth
2. No specific abnormality seen radiographically within the imaged
portions of the cervical spine

## 2021-03-05 ENCOUNTER — Encounter (HOSPITAL_COMMUNITY): Payer: Self-pay | Admitting: Emergency Medicine

## 2021-03-05 ENCOUNTER — Emergency Department (HOSPITAL_COMMUNITY): Payer: 59

## 2021-03-05 ENCOUNTER — Emergency Department (HOSPITAL_COMMUNITY)
Admission: EM | Admit: 2021-03-05 | Discharge: 2021-03-05 | Disposition: A | Discharge status: Home / Self Care | Payer: 59 | Attending: Emergency Medicine | Admitting: Emergency Medicine

## 2021-03-05 DIAGNOSIS — R002 Palpitations: Secondary | ICD-10-CM | POA: Insufficient documentation

## 2021-03-05 DIAGNOSIS — R42 Dizziness and giddiness: Secondary | ICD-10-CM | POA: Insufficient documentation

## 2021-03-05 DIAGNOSIS — I10 Essential (primary) hypertension: Secondary | ICD-10-CM | POA: Diagnosis not present

## 2021-03-05 LAB — BASIC METABOLIC PANEL
Anion gap: 7 (ref 5–15)
BUN: 15 mg/dL (ref 6–20)
CO2: 25 mmol/L (ref 22–32)
Calcium: 9.1 mg/dL (ref 8.9–10.3)
Chloride: 105 mmol/L (ref 98–111)
Creatinine, Ser: 0.94 mg/dL (ref 0.61–1.24)
GFR, Estimated: >60 mL/min (ref >60)
Glucose, Bld: 88 mg/dL (ref 70–99)
Potassium: 4.3 mmol/L (ref 3.5–5.1)
Sodium: 137 mmol/L (ref 135–145)

## 2021-03-05 LAB — CBG MONITORING, ED
Glucose-Capillary: 115 mg/dL — ABNORMAL HIGH (ref 70–99)
Glucose-Capillary: 68 mg/dL — ABNORMAL LOW (ref 70–99)
Glucose-Capillary: 162 mg/dL — ABNORMAL HIGH (ref 70–99)

## 2021-03-05 LAB — CBC
HCT: 48.1 % (ref 39.0–52.0)
Hemoglobin: 16.3 g/dL (ref 13.0–17.0)
MCH: 30.4 pg (ref 26.0–34.0)
MCHC: 33.9 g/dL (ref 30.0–36.0)
MCV: 89.6 fL (ref 80.0–100.0)
Platelets: 258 10*3/uL (ref 150–400)
RBC: 5.37 MIL/uL (ref 4.22–5.81)
RDW: 13.9 % (ref 11.5–15.5)
WBC: 6.8 10*3/uL (ref 4.0–10.5)
nRBC: 0 % (ref 0.0–0.2)

## 2021-03-05 LAB — TROPONIN I (HIGH SENSITIVITY)
Troponin I (High Sensitivity): 4 ng/L (ref <18)
Troponin I (High Sensitivity): 4 ng/L (ref <18)

## 2021-03-05 MED ORDER — SODIUM CHLORIDE 0.9 % IV BOLUS
500.0000 mL | Freq: Once | INTRAVENOUS | Status: AC
  Administered 2021-03-05: 500 mL via INTRAVENOUS

## 2021-03-05 MED ORDER — SODIUM CHLORIDE 0.9 % IV BOLUS
1000.0000 mL | Freq: Once | INTRAVENOUS | Status: AC
  Administered 2021-03-05: 1000 mL via INTRAVENOUS

## 2021-03-05 MED ORDER — DEXTROSE 50 % IV SOLN
1.0000 | Freq: Once | INTRAVENOUS | Status: DC
Start: 2021-03-05 — End: 2021-03-06

## 2021-03-05 MED ORDER — GLUCOSE 40 % PO GEL: 1.0000 | Freq: Once | ORAL | Status: DC

## 2021-03-05 NOTE — Discharge Instructions (Addendum)
The testing did not show any serious problems.  Make sure you are getting plenty of rest, drink a lot of fluids and try to eat and drink regularly.  See the doctor of your choice if needed for problems

## 2021-03-05 NOTE — ED Provider Notes (Signed)
[facility] EMERGENCY DEPARTMENT Provider Note   CSN: [id number] Arrival date & time: 03/05/21  1317     History Chief Complaint  Patient presents with  . Dizziness    Michael Crane is a 30 y.o. male otherwise healthy no daily medication use.  Patient reports yesterday afternoon he was working on his car, he got up from a lying position when he felt lightheaded, he describes a feeling that he was Crane to pass out.  He held himself steady and after short amount time this feeling resolved.  Patient reports that throughout the night whenever he went from a sitting to a standing position or lying to sitting the same feeling would occur and was occasionally associated with a heart racing sensation.  He reports that his mother is a Engineer, civil (consulting) and encouraged him to go to an urgent care today.  When he was at the urgent care they told him his blood pressure was elevated 170 systolic and sent him to the emergency department for further evaluation.  Patient reports that he has had continued episodes of lightheadedness throughout the day only when he changes positions and each time it resolves with staying still.  Of note patient reports that he started a new diet 2 weeks ago, the carnivore diet.  He reports that he only eats meat and eggs and other protein sources he does not eat any carbs or sugar.  Denies fever/chills, recent illness, chest pain, shortness of breath, cough, abdominal pain, vomiting, diarrhea, dysuria/hematuria, extremity swelling/color change, numbness/weakness, tingling or any additional concerns  HPI     History reviewed. No pertinent past medical history.  Patient Active Problem List   Diagnosis Date Noted  . Acute pain of right knee 09/15/2017  . 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 Labile hypertension     Past Surgical History:  Procedure Laterality Date  . APPENDECTOMY    . SHOULDER ARTHROSCOPY Left 07/20/2015    Procedure: Left Shoulder Arthroscopy with Labral Repair;  Surgeon: Kathryne Hitch, MD;  Location: [facility] OR;  Service: Orthopedics;  Laterality: Left;  . SHOULDER SURGERY     pin in left shoulder       Family History  Problem Relation Age of Onset  . Diabetes Father   . Heart disease Father     Social History   Tobacco Use  . Smoking status: Never Smoker  . Smokeless tobacco: Current User    Types: Chew  Substance Use Topics  . Alcohol use: Yes    Comment: ocassional  . Drug use: No    Home Medications Prior to Admission medications   Medication Sig Start Date End Date Taking? Authorizing Provider  meloxicam (MOBIC) 15 MG tablet Take 1 tablet (15 mg total) by mouth daily. 04/17/18   Arthor Captain, PA-C  methylPREDNISolone (MEDROL DOSEPAK) 4 MG TBPK tablet Use as directed 04/17/18   Arthor Captain, PA-C  naproxen (NAPROSYN) 500 MG tablet Take 1 tablet (500 mg total) by mouth 2 (two) times daily. Patient not taking: Reported on 09/15/2017 09/01/17   Dietrich Pates, PA-C  oxyCODONE-acetaminophen (ROXICET) 5-325 MG per tablet Take 1-2 tablets by mouth every 4 (four) hours as needed. Patient not taking: Reported on 04/29/2016 07/20/15   Kathryne Hitch, MD  predniSONE (DELTASONE) 10 MG tablet Take 2 tablets (20 mg total) by mouth daily. Patient not taking: Reported on 09/15/2017 04/29/16   Tharon Aquas, PA  traMADol (ULTRAM) 50 MG tablet Take 1  tablet (50 mg total) by mouth every 6 (six) hours as needed for moderate pain. 09/01/17   Kirtland Bouchard, PA-C    Allergies    Patient has no known allergies.  Review of Systems   Review of Systems Ten systems are reviewed and are negative for acute change except as noted in the HPI  Physical Exam Updated Vital Signs BP 140/80 (BP Location: Right Arm)   Pulse 68   Temp 98.4 F (36.9 C) (Oral)   Resp 18   Ht 6\' 3"  (1.905 m)   Wt 117.9 kg   SpO2 100%   BMI 32.50 kg/m   Physical Exam Constitutional:      General: He  is not in acute distress.    Appearance: Normal appearance. He is well-developed. He is not ill-appearing or diaphoretic.  HENT:     Head: Normocephalic and atraumatic.  Eyes:     General: Vision grossly intact. Gaze aligned appropriately.     Pupils: Pupils are equal, round, and reactive to light.  Neck:     Trachea: Trachea and phonation normal.  Cardiovascular:     Rate and Rhythm: Normal rate and regular rhythm.     Pulses: Normal pulses.  Pulmonary:     Effort: Pulmonary effort is normal. No respiratory distress.     Breath sounds: Normal breath sounds.  Abdominal:     General: There is no distension.     Palpations: Abdomen is soft.     Tenderness: There is no abdominal tenderness. There is no guarding or rebound.  Musculoskeletal:        General: Normal range of motion.     Cervical back: Normal range of motion.  Skin:    General: Skin is warm and dry.  Neurological:     Mental Status: He is alert.     GCS: GCS eye subscore is 4. GCS verbal subscore is 5. GCS motor subscore is 6.     Comments: Speech is clear and goal oriented, follows commands Major Cranial nerves without deficit, no facial droop Normal strength in upper and lower extremities bilaterally including dorsiflexion and plantar flexion, strong and equal grip strength Sensation normal to light and sharp touch Moves extremities without ataxia, coordination intact Normal finger to nose and rapid alternating movements Neg romberg, no pronator drift Normal gait Normal heel-shin and balance  Psychiatric:        Behavior: Behavior normal.     ED Results / Procedures / Treatments   Labs (all labs ordered are listed, but only abnormal results are displayed) Labs Reviewed  BASIC METABOLIC PANEL  CBC  TROPONIN I (HIGH SENSITIVITY)  TROPONIN I (HIGH SENSITIVITY)    EKG EKG Interpretation  Date/Time:  Monday March 05 2021 13:35:45 EDT Ventricular Rate:  76 PR Interval:  128 QRS Duration: 92 QT  Interval:  358 QTC Calculation: 402 R Axis:   35 Text Interpretation: Normal sinus rhythm with sinus arrhythmia Nonspecific T wave abnormality Abnormal ECG Since last tracing rate slower Otherwise no significant change Confirmed by 07-11-1984 [phone]) on 03/05/2021 6:42:23 PM   Radiology DG Chest 2 View  Result Date: 03/05/2021 CLINICAL DATA:  Dizziness and abnormal electrocardiogram EXAM: CHEST - 2 VIEW COMPARISON:  August 17, 2014 FINDINGS: Lungs are clear. Heart size and pulmonary vascularity are normal. No adenopathy. Postoperative change in left clavicle. IMPRESSION: Lungs clear.  Cardiac silhouette normal. Electronically Signed   By: August 19, 2014 III M.D.   On: 03/05/2021 14:31  Procedures Procedures   Medications Ordered in ED Medications - No data to display  ED Course  I have reviewed the triage vital signs and the nursing notes.  Pertinent labs & imaging results that were available during my care of the patient were reviewed by me and considered in my medical decision making (see chart for details).    MDM Rules/Calculators/A&P                         Additional history obtained from: 1. Nursing notes from this visit. 2. Review of electronic medical records.  No pertinent recent visits. ----------------- 30 year old male presented for lightheadedness and feelings of near syncope that began yesterday, the sensations only occur with postural changes.  He reports some associated palpitations but no chest pain or shortness of breath.  On exam he is well-appearing no acute distress.  Cranial nerves intact, no meningeal signs, airway clear, cardiopulmonary exam unremarkable.  Abdomen soft nontender.  Neurovascular tact all 4 extremities.  Vital signs are stable.  He did become somewhat lightheaded with standing for completion of neuro examination.  Symptoms suspicious for orthostatic hypotension.  He does have recent dietary change and only eats meat and protein now no  longer eating any carbohydrates or sugar which may be contributing to his symptoms.  Will obtain basic labs. - CBG low at 68, will give patient juice and food. CBC within normal limits, no leukocytosis to suggest infectious process, no anemia. BMP shows no emergent electrolyte derangement, AKI or gap. High-sensitivity troponin within normal limits x2.  Chest x-ray:  IMPRESSION:  Lungs clear. Cardiac silhouette normal.   EKG: Normal sinus rhythm with sinus arrhythmia Nonspecific T wave abnormality Abnormal ECG Since last tracing rate slower Otherwise no significant change Confirmed by Mancel Bale [phone]) on 03/05/2021 6:42:23 PM  Care handoff given to Dr. Effie Shy at shift change. Patient receiving second fluid bolus and awaiting orthostatics. Pending no emergent findings anticipate discharge. Final disposition per oncoming team.  Note: Portions of this report may have been transcribed using voice recognition software. Every effort was made to ensure accuracy; however, inadvertent computerized transcription errors may still be present. Final Clinical Impression(s) / ED Diagnoses Final diagnoses:  None    Rx / DC Orders ED Discharge Orders    None       Elizabeth Palau 03/05/21 1901    Mancel Bale, MD 03/06/21 1021

## 2021-03-05 NOTE — ED Triage Notes (Signed)
Pt arrives to Ed after being sent by [facility] for hypertension and abnormal EKG.pt went due to having dizzy episodes that started yesterday.  He was noted to have BP in 170s at [facility].

## 2021-03-05 NOTE — ED Provider Notes (Signed)
  Face-to-face evaluation   History: Patient presenting for evaluation orthostatic lightheadedness, since yesterday, persistent.  He has also noticed sensation of racing heart.  They went to an urgent care center and was found to have an elevated blood pressure so was sent here for further evaluation.  He denies headache, paresthesia or weakness.  He is on a meat diet, as an effort to improve his overall health.  He denies nausea, vomiting, fever or chills.  Physical exam: Middle-aged male, appears comfortable, he has been eating and drinking without problems.  No dysarthria or aphasia.  Nontoxic appearance.  Medical screening examination/treatment/procedure(s) were conducted as a shared visit with non-physician practitioner(s) and myself.  I personally evaluated the patient during the encounter    Mancel Bale, MD 03/05/21 2126

## 2021-03-05 NOTE — ED Notes (Signed)
RN gave pt sandwich and apple juice

## 2021-03-05 NOTE — ED Notes (Signed)
pT AMBULATED TO THE BATHROOM AND BACK. PT FELT A LITTLE DIZZY.
